# Patient Record
Sex: Female | Born: 1965 | Race: Black or African American | Hispanic: No | Smoking: Never smoker
Health system: Southern US, Community
[De-identification: ages and names within clinical notes are randomized; demographics above are authoritative.]

## PROBLEM LIST (undated history)

## (undated) DIAGNOSIS — T8859XA Other complications of anesthesia, initial encounter: Secondary | ICD-10-CM

## (undated) DIAGNOSIS — K219 Gastro-esophageal reflux disease without esophagitis: Secondary | ICD-10-CM

## (undated) DIAGNOSIS — I371 Nonrheumatic pulmonary valve insufficiency: Secondary | ICD-10-CM

## (undated) HISTORY — PX: OSTEOTOMY: SHX137

## (undated) HISTORY — PX: BREAST SURGERY: SHX581

---

## 2010-05-18 ENCOUNTER — Emergency Department: Payer: Self-pay | Admitting: Emergency Medicine

## 2010-09-22 ENCOUNTER — Emergency Department: Payer: Self-pay | Admitting: Emergency Medicine

## 2011-10-24 ENCOUNTER — Other Ambulatory Visit: Payer: Self-pay | Admitting: Specialist

## 2011-10-29 ENCOUNTER — Inpatient Hospital Stay
Admission: RE | Admit: 2011-10-29 | Discharge: 2011-10-29 | Payer: Self-pay | Source: Ambulatory Visit | Attending: Specialist | Admitting: Specialist

## 2011-11-01 ENCOUNTER — Ambulatory Visit
Admission: RE | Admit: 2011-11-01 | Discharge: 2011-11-01 | Disposition: A | Discharge status: Home / Self Care | Payer: Medicare Other | Source: Ambulatory Visit | Attending: Specialist | Admitting: Specialist

## 2015-11-30 ENCOUNTER — Other Ambulatory Visit (HOSPITAL_BASED_OUTPATIENT_CLINIC_OR_DEPARTMENT_OTHER): Payer: Self-pay | Admitting: Family Medicine

## 2015-11-30 ENCOUNTER — Ambulatory Visit (HOSPITAL_BASED_OUTPATIENT_CLINIC_OR_DEPARTMENT_OTHER)
Admission: RE | Admit: 2015-11-30 | Discharge: 2015-11-30 | Disposition: A | Discharge status: Home / Self Care | Payer: Medicare Other | Source: Ambulatory Visit | Attending: Family Medicine | Admitting: Family Medicine

## 2015-11-30 DIAGNOSIS — E049 Nontoxic goiter, unspecified: Secondary | ICD-10-CM

## 2016-12-10 ENCOUNTER — Other Ambulatory Visit (HOSPITAL_BASED_OUTPATIENT_CLINIC_OR_DEPARTMENT_OTHER): Payer: Self-pay | Admitting: Family Medicine

## 2016-12-10 DIAGNOSIS — N951 Menopausal and female climacteric states: Secondary | ICD-10-CM

## 2016-12-10 DIAGNOSIS — E041 Nontoxic single thyroid nodule: Secondary | ICD-10-CM

## 2016-12-11 ENCOUNTER — Other Ambulatory Visit (HOSPITAL_BASED_OUTPATIENT_CLINIC_OR_DEPARTMENT_OTHER): Payer: No Typology Code available for payment source

## 2016-12-11 ENCOUNTER — Ambulatory Visit (HOSPITAL_BASED_OUTPATIENT_CLINIC_OR_DEPARTMENT_OTHER)
Admission: RE | Admit: 2016-12-11 | Discharge: 2016-12-11 | Disposition: A | Discharge status: Home / Self Care | Payer: Medicare Other | Source: Ambulatory Visit | Attending: Family Medicine | Admitting: Family Medicine

## 2016-12-11 ENCOUNTER — Ambulatory Visit (HOSPITAL_BASED_OUTPATIENT_CLINIC_OR_DEPARTMENT_OTHER): Admission: RE | Admit: 2016-12-11 | Payer: Medicare Other | Source: Ambulatory Visit

## 2016-12-11 DIAGNOSIS — E041 Nontoxic single thyroid nodule: Secondary | ICD-10-CM

## 2016-12-11 DIAGNOSIS — N951 Menopausal and female climacteric states: Secondary | ICD-10-CM | POA: Diagnosis present

## 2017-07-26 ENCOUNTER — Encounter (HOSPITAL_BASED_OUTPATIENT_CLINIC_OR_DEPARTMENT_OTHER): Payer: Self-pay | Admitting: Emergency Medicine

## 2017-07-26 ENCOUNTER — Other Ambulatory Visit: Payer: Self-pay

## 2017-07-26 ENCOUNTER — Emergency Department (HOSPITAL_BASED_OUTPATIENT_CLINIC_OR_DEPARTMENT_OTHER)
Admission: EM | Admit: 2017-07-26 | Discharge: 2017-07-26 | Disposition: A | Discharge status: Home / Self Care | Payer: Medicare Other | Attending: Emergency Medicine | Admitting: Emergency Medicine

## 2017-07-26 DIAGNOSIS — Z0189 Encounter for other specified special examinations: Secondary | ICD-10-CM

## 2017-07-26 DIAGNOSIS — R2231 Localized swelling, mass and lump, right upper limb: Secondary | ICD-10-CM | POA: Diagnosis present

## 2017-07-26 DIAGNOSIS — Z7689 Persons encountering health services in other specified circumstances: Secondary | ICD-10-CM

## 2017-07-26 DIAGNOSIS — Z79899 Other long term (current) drug therapy: Secondary | ICD-10-CM | POA: Diagnosis not present

## 2017-07-26 DIAGNOSIS — L03011 Cellulitis of right finger: Secondary | ICD-10-CM | POA: Insufficient documentation

## 2017-07-26 HISTORY — DX: Nonrheumatic pulmonary valve insufficiency: I37.1

## 2017-07-26 MED ORDER — LIDOCAINE HCL 2 % IJ SOLN
10.0000 mL | Freq: Once | INTRAMUSCULAR | Status: AC
  Administered 2017-07-26: 200 mg via INTRADERMAL
  Filled 2017-07-26: qty 20

## 2017-07-26 MED ORDER — HYDROCODONE-ACETAMINOPHEN 5-325 MG PO TABS: 1.0000 | ORAL_TABLET | Freq: Four times a day (QID) | ORAL | 0 refills | Status: DC | PRN

## 2017-07-26 MED ORDER — SULFAMETHOXAZOLE-TRIMETHOPRIM 800-160 MG PO TABS: 1.0000 | ORAL_TABLET | Freq: Two times a day (BID) | ORAL | 0 refills | Status: AC

## 2017-07-26 NOTE — Discharge Instructions (Signed)
Soak the affected finger in warm water for 20 minutes every two hours when awake. You have been given a prescription for antibiotics to take if the pain and swelling do not begin to resolve within the next 24-48 hours. You have also been given a prescription of pain medications for the next few days. Do not go to work, operate a vehicle, or operate any other machinery while taking this medication. Return to the ED if you experience any fevers, chills, worsening redness, swelling, or pain to the area.

## 2017-07-26 NOTE — ED Provider Notes (Signed)
..  Incision and Drainage Date/Time: 07/26/2017 7:47 PM Performed by: Renne CriglerGeiple, Kayslee Furey, PA-C Authorized by: Renne CriglerGeiple, Monterius Rolf, PA-C   Consent:    Consent obtained:  Verbal   Consent given by:  Patient   Risks discussed:  Bleeding, incomplete drainage, pain and infection   Alternatives discussed:  No treatment and observation Location:    Type:  Abscess   Location:  Upper extremity   Upper extremity location:  Hand   Hand location:  R hand Pre-procedure details:    Skin preparation:  Betadine Anesthesia (see MAR for exact dosages):    Anesthesia method:  Local infiltration   Local anesthetic:  Lidocaine 1% w/o epi Procedure type:    Complexity:  Simple Procedure details:    Incision types:  Stab incision   Scalpel blade:  11   Drainage:  Bloody   Drainage amount:  Scant   Wound treatment:  Wound left open   Packing materials:  None Post-procedure details:    Patient tolerance of procedure:  Tolerated well, no immediate complications     Renne CriglerGeiple, Dyland Panuco, Cordelia Poche-C 07/26/17 1951    Tilden Fossaees, Elizabeth, MD 07/26/17 2205

## 2017-07-26 NOTE — ED Triage Notes (Signed)
Reports pain in R index finger near cuticle increasing over the last 3 days. Skin intact, no drainage, minimal edema, some erythema.

## 2017-07-26 NOTE — ED Provider Notes (Signed)
[facility] EMERGENCY DEPARTMENT Provider Note   CSN: [id number] Arrival date & time: 07/26/17  1816     History   Chief Complaint Chief Complaint  Patient presents with  . Hand Pain    HPI Lori BattiestCynthia Martin is a 51 y.o. female.  HPI   Pt is a 51 y/o female who presents to the ED c/o left index finger pain that began 4 days ago. The pain is throbbing in nature and she rates it at a 7/10. It has worsened since onset. She also reports redness and swelling, but denies any fevers, chills or other symptoms. She states that she is still able to move her finger. No other areas are affected and she has no other complaints. She denies any drainage or trauma to the area.  Past Medical History:  Diagnosis Date  . Leaky pulmonary heart valve     There are no active problems to display for this patient.   Past Surgical History:  Procedure Laterality Date  . BREAST SURGERY     lumpectomy x3  . CESAREAN SECTION    . OSTEOTOMY      OB History    No data available       Home Medications    Prior to Admission medications   Medication Sig Start Date End Date Taking? Authorizing Provider  Estradiol-Estriol-Progesterone (BIEST/PROGESTERONE) CREA Place onto the skin.   Yes [provider]  Nutritional Supplements (DHEA PO) Take by mouth.   Yes [provider]  progesterone (PROMETRIUM) 100 MG capsule Take 100 mg by mouth daily.   Yes [provider]  HYDROcodone-acetaminophen (NORCO/VICODIN) 5-325 MG tablet Take 1 tablet by mouth every 6 (six) hours as needed. 07/26/17   Mckinnon Glick S, PA-C  sulfamethoxazole-trimethoprim (BACTRIM DS,SEPTRA DS) 800-160 MG tablet Take 1 tablet by mouth 2 (two) times daily for 7 days. 07/26/17 08/02/17  Alyzae Hawkey S, PA-C    Family History No family history on file.  Social History Social History   Tobacco Use  . Smoking status: Never Smoker  . Smokeless tobacco: Never Used  Substance Use Topics  .  Alcohol use: No    Frequency: Never  . Drug use: No     Allergies   Patient has no known allergies.   Review of Systems Review of Systems  Constitutional: Negative for chills and fever.  Respiratory: Negative.   Cardiovascular: Negative.   Musculoskeletal:       ROM intact to the left index finger  Skin: Negative for rash.       Redness, swelling, and pain to the left index finger, no drainage or trauma  All other systems reviewed and are negative.    Physical Exam Updated Vital Signs BP 123/70 (BP Location: Right Arm)   Pulse 63   Temp 98.2 F (36.8 C) (Oral)   Resp 20   Ht 5\' 7"  (1.702 m)   Wt 68 kg (150 lb)   SpO2 100%   BMI 23.49 kg/m   Physical Exam  Constitutional: She is oriented to person, place, and time. She appears well-developed and well-nourished.  HENT:  Head: Normocephalic and atraumatic.  Eyes: Conjunctivae and EOM are normal.  Cardiovascular: Normal rate.  Pulmonary/Chest: Effort normal.  Musculoskeletal:  Normal ROM of the right index finger  Neurological: She is alert and oriented to person, place, and time.  Skin: Skin is warm. Capillary refill takes less than 2 seconds.  Right index finger is swollen, erythematous and painful. There is no  drainage to the area. The joint is unaffected.     ED Treatments / Results  Labs (all labs ordered are listed, but only abnormal results are displayed) Labs Reviewed - No data to display  EKG  EKG Interpretation None       Radiology No results found.  Procedures Procedures (including critical care time) Procedure note completed by [place] Mountain LakeGeiple, PA.  Medications Ordered in ED Medications  lidocaine (XYLOCAINE) 2 % (with pres) injection 200 mg (not administered)     Initial Impression / Assessment and Plan / ED Course  I have reviewed the triage vital signs and the nursing notes.  Pertinent labs & imaging results that were available during my care of the patient were reviewed by me and  considered in my medical decision making (see chart for details).   Evaluated pt in the ED. Discussed the option to send her home with antibiotics versus perform and I&D in the emergency department. Pt feels very strongly about avoiding antibiotics if possible and would like to opt to have the I&D. [facility] and there was no pus expelled from the wound. Discussed that since there was no pus removed from the wound that she may need to take antibiotics to clear the infection. Pt would like to observe if there is improvement of the finger pain before starting antibiotics. Advised pt that if she is no better in 24-48 then she should begin the antibiotics. I also wrote a prescription for Vicodin since she is in a significant amount of pain from this. 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 Return precautions were discussed and pt understands the plan. All questions answered.   Final Clinical Impressions(s) / ED Diagnoses   Final diagnoses:  Paronychia of right index finger  Encounter for incision and drainage procedure   Pt with paronychia in the ED. I&D performed in the ED. Pt still has FROM to the finger and is afebrile. She is safe for discharge with instructions to begin Bactrim if she does not observe improvement in the next 24-48 hours.   ED Discharge Orders        Ordered    sulfamethoxazole-trimethoprim (BACTRIM DS,SEPTRA DS) 800-160 MG tablet  2 times daily     07/26/17 1958    HYDROcodone-acetaminophen (NORCO/VICODIN) 5-325 MG tablet  Every 6 hours PRN     07/26/17 1958       Karrie MeresCouture, Shian Goodnow S, PA-C 07/26/17 2015    Tilden Fossaees, Elizabeth, MD 07/26/17 2205

## 2017-07-30 ENCOUNTER — Encounter (HOSPITAL_BASED_OUTPATIENT_CLINIC_OR_DEPARTMENT_OTHER): Payer: Self-pay | Admitting: Emergency Medicine

## 2017-07-30 ENCOUNTER — Other Ambulatory Visit: Payer: Self-pay

## 2017-07-30 ENCOUNTER — Emergency Department (HOSPITAL_BASED_OUTPATIENT_CLINIC_OR_DEPARTMENT_OTHER)
Admission: EM | Admit: 2017-07-30 | Discharge: 2017-07-31 | Disposition: A | Discharge status: Home / Self Care | Payer: Medicare Other | Attending: Emergency Medicine | Admitting: Emergency Medicine

## 2017-07-30 DIAGNOSIS — Z79899 Other long term (current) drug therapy: Secondary | ICD-10-CM | POA: Insufficient documentation

## 2017-07-30 DIAGNOSIS — L03011 Cellulitis of right finger: Secondary | ICD-10-CM | POA: Diagnosis not present

## 2017-07-30 DIAGNOSIS — R2231 Localized swelling, mass and lump, right upper limb: Secondary | ICD-10-CM | POA: Diagnosis present

## 2017-07-30 NOTE — ED Triage Notes (Signed)
Pt presents with pain and swelling to index finger to right hand.

## 2017-07-30 NOTE — ED Provider Notes (Signed)
[facility] EMERGENCY DEPARTMENT Provider Note   CSN: [id number] Arrival date & time: 07/30/17  2316     History   Chief Complaint Chief Complaint  Patient presents with  . Hand Pain    HPI Lori Martin is a 51 y.o. female.  The history is provided by the patient.  She comes in with pain and swelling of the right second finger distal phalanx.  She had been seen in emergency department on December 9 with paronychia, treated with incision and drainage.  About 2 days later, the swelling recurred and has been getting progressively worse.  She now rates pain at 10/10.  She had been discharged with prescription for hydrocodone-acetaminophen which she has been taking go without relief.  She denies fever chills or sweats.  Past Medical History:  Diagnosis Date  . Leaky pulmonary heart valve     There are no active problems to display for this patient.   Past Surgical History:  Procedure Laterality Date  . BREAST SURGERY     lumpectomy x3  . CESAREAN SECTION    . OSTEOTOMY      OB History    No data available       Home Medications    Prior to Admission medications   Medication Sig Start Date End Date Taking? Authorizing Provider  Estradiol-Estriol-Progesterone (BIEST/PROGESTERONE) CREA Place onto the skin.    [provider]  HYDROcodone-acetaminophen (NORCO/VICODIN) 5-325 MG tablet Take 1 tablet by mouth every 6 (six) hours as needed. 07/26/17   Couture, Cortni S, PA-C  Nutritional Supplements (DHEA PO) Take by mouth.    [provider]  progesterone (PROMETRIUM) 100 MG capsule Take 100 mg by mouth daily.    [provider]  sulfamethoxazole-trimethoprim (BACTRIM DS,SEPTRA DS) 800-160 MG tablet Take 1 tablet by mouth 2 (two) times daily for 7 days. 07/26/17 08/02/17  Couture, Cortni S, PA-C    Family History No family history on file.  Social History Social History   Tobacco Use  . Smoking status: Never Smoker  . Smokeless  tobacco: Never Used  Substance Use Topics  . Alcohol use: No    Frequency: Never  . Drug use: No     Allergies   Patient has no known allergies.   Review of Systems Review of Systems  All other systems reviewed and are negative.    Physical Exam Updated Vital Signs BP 109/72 (BP Location: Left Arm)   Pulse 70   Temp 98.1 F (36.7 C) (Oral)   Resp 18   Ht 5\' 7"  (1.702 m)   Wt 68 kg (150 lb)   SpO2 99%   BMI 23.49 kg/m   Physical Exam  Nursing note and vitals reviewed.  51 year old female, resting comfortably and in no acute distress. Vital signs are normal. Oxygen saturation is 99%, which is normal. Head is normocephalic and atraumatic. PERRLA, EOMI. Oropharynx is clear. Neck is nontender and supple without adenopathy or JVD. Back is nontender and there is no CVA tenderness. Lungs are clear without rales, wheezes, or rhonchi. Chest is nontender. Heart has regular rate and rhythm without murmur. Abdomen is soft, flat, nontender without masses or hepatosplenomegaly and peristalsis is normoactive. Extremities: Paronychia present right second finger. Skin is warm and dry without rash. Neurologic: Mental status is normal, cranial nerves are intact, there are no motor or sensory deficits.  ED Treatments / Results   Procedures .Marland Kitchen.Incision and Drainage Date/Time: 07/31/2017 12:19 AM Performed by: Dione BoozeGlick, Lamarion Mcevers, MD Authorized  by: Dione BoozeGlick, Lahna Nath, MD   Consent:    Consent obtained:  Verbal   Consent given by:  Patient   Risks discussed:  Bleeding, incomplete drainage, pain and infection   Alternatives discussed:  Delayed treatment Location:    Type:  Abscess   Location:  Upper extremity   Upper extremity location:  Finger   Finger location:  R index finger Pre-procedure details:    Skin preparation:  Chloraprep Anesthesia (see MAR for exact dosages):    Anesthesia method:  Nerve block   Block location:  Right second finger digital block   Block needle gauge:  25  G   Block anesthetic:  Lidocaine 2% w/o epi   Block injection procedure:  Anatomic landmarks identified, anatomic landmarks palpated, introduced needle, incremental injection and negative aspiration for blood   Block outcome:  Anesthesia achieved Procedure type:    Complexity:  Complex Procedure details:    Needle aspiration: no     Incision types:  Stab incision   Incision depth:  Subcutaneous   Scalpel size: Scissors.   Wound management:  Probed and deloculated   Drainage:  Purulent   Drainage amount:  Moderate   Wound treatment:  Wound left open   Packing materials:  None Post-procedure details:    Patient tolerance of procedure:  Tolerated well, no immediate complications   (including critical care time)  Medications Ordered in ED Medications  lidocaine (XYLOCAINE) 2 % (with pres) injection 200 mg (not administered)     Initial Impression / Assessment and Plan / ED Course  I have reviewed the triage vital signs and the nursing notes.  Paronychia right second finger.  Old records are reviewed confirming ED visit on December 9 for paronychia treated with incision and drainage.  Procedure note states stab incision was made to drain the infection.  Incision and drainage is repeated, and the wound explored with blunt dissection.  Given treatment failure, will have her follow-up with hand surgery to ensure adequate healing.  Final Clinical Impressions(s) / ED Diagnoses   Final diagnoses:  Paronychia of right index finger    ED Discharge Orders        Ordered    HYDROcodone-acetaminophen (NORCO/VICODIN) 5-325 MG tablet  Every 6 hours PRN     07/31/17 0035       Dione BoozeGlick, Emmet Messer, MD 07/31/17 [phone]

## 2017-07-31 MED ORDER — HYDROCODONE-ACETAMINOPHEN 5-325 MG PO TABS: 1.0000 | ORAL_TABLET | Freq: Four times a day (QID) | ORAL | 0 refills | Status: DC | PRN

## 2017-07-31 MED ORDER — LIDOCAINE HCL 2 % IJ SOLN
10.0000 mL | Freq: Once | INTRAMUSCULAR | Status: AC
  Administered 2017-07-31: 400 mg
  Filled 2017-07-31: qty 20

## 2017-07-31 NOTE — ED Notes (Signed)
Pt discharged to home with family. NAD.  

## 2018-07-01 ENCOUNTER — Other Ambulatory Visit (HOSPITAL_BASED_OUTPATIENT_CLINIC_OR_DEPARTMENT_OTHER): Payer: Self-pay | Admitting: Family Medicine

## 2018-07-01 ENCOUNTER — Encounter (HOSPITAL_BASED_OUTPATIENT_CLINIC_OR_DEPARTMENT_OTHER): Payer: Self-pay

## 2018-07-01 ENCOUNTER — Ambulatory Visit (HOSPITAL_BASED_OUTPATIENT_CLINIC_OR_DEPARTMENT_OTHER)
Admission: RE | Admit: 2018-07-01 | Discharge: 2018-07-01 | Disposition: A | Discharge status: Home / Self Care | Payer: Medicare Other | Source: Ambulatory Visit | Attending: Family Medicine | Admitting: Family Medicine

## 2018-07-01 DIAGNOSIS — R918 Other nonspecific abnormal finding of lung field: Secondary | ICD-10-CM | POA: Insufficient documentation

## 2018-07-01 DIAGNOSIS — R748 Abnormal levels of other serum enzymes: Secondary | ICD-10-CM

## 2018-07-01 DIAGNOSIS — E042 Nontoxic multinodular goiter: Secondary | ICD-10-CM

## 2018-08-14 ENCOUNTER — Other Ambulatory Visit: Payer: Self-pay

## 2018-08-14 ENCOUNTER — Encounter (HOSPITAL_BASED_OUTPATIENT_CLINIC_OR_DEPARTMENT_OTHER): Payer: Self-pay | Admitting: *Deleted

## 2018-08-14 ENCOUNTER — Emergency Department (HOSPITAL_BASED_OUTPATIENT_CLINIC_OR_DEPARTMENT_OTHER)
Admission: EM | Admit: 2018-08-14 | Discharge: 2018-08-14 | Disposition: A | Discharge status: Home / Self Care | Payer: Medicare Other | Attending: Emergency Medicine | Admitting: Emergency Medicine

## 2018-08-14 DIAGNOSIS — Y939 Activity, unspecified: Secondary | ICD-10-CM | POA: Insufficient documentation

## 2018-08-14 DIAGNOSIS — Z5321 Procedure and treatment not carried out due to patient leaving prior to being seen by health care provider: Secondary | ICD-10-CM | POA: Insufficient documentation

## 2018-08-14 DIAGNOSIS — Y929 Unspecified place or not applicable: Secondary | ICD-10-CM | POA: Insufficient documentation

## 2018-08-14 DIAGNOSIS — W311XXA Contact with metalworking machines, initial encounter: Secondary | ICD-10-CM | POA: Diagnosis not present

## 2018-08-14 DIAGNOSIS — Y999 Unspecified external cause status: Secondary | ICD-10-CM | POA: Diagnosis not present

## 2018-08-14 DIAGNOSIS — S6992XA Unspecified injury of left wrist, hand and finger(s), initial encounter: Secondary | ICD-10-CM | POA: Diagnosis present

## 2018-08-14 NOTE — ED Triage Notes (Signed)
Left index finger laceration with hand saw.  No bleeding noted. Small 1-2cm lac near knuckle.

## 2018-08-14 NOTE — ED Notes (Signed)
Pt told registration that they were leaving to go home.

## 2019-08-15 ENCOUNTER — Encounter (HOSPITAL_BASED_OUTPATIENT_CLINIC_OR_DEPARTMENT_OTHER): Payer: Self-pay | Admitting: *Deleted

## 2019-08-15 ENCOUNTER — Other Ambulatory Visit: Payer: Self-pay

## 2019-08-15 ENCOUNTER — Emergency Department (HOSPITAL_BASED_OUTPATIENT_CLINIC_OR_DEPARTMENT_OTHER)
Admission: EM | Admit: 2019-08-15 | Discharge: 2019-08-16 | Disposition: A | Discharge status: Home / Self Care | Payer: Medicare Other | Attending: Emergency Medicine | Admitting: Emergency Medicine

## 2019-08-15 ENCOUNTER — Emergency Department (HOSPITAL_BASED_OUTPATIENT_CLINIC_OR_DEPARTMENT_OTHER): Payer: Medicare Other

## 2019-08-15 DIAGNOSIS — Z79899 Other long term (current) drug therapy: Secondary | ICD-10-CM | POA: Diagnosis not present

## 2019-08-15 DIAGNOSIS — R002 Palpitations: Secondary | ICD-10-CM | POA: Diagnosis not present

## 2019-08-15 DIAGNOSIS — R42 Dizziness and giddiness: Secondary | ICD-10-CM | POA: Diagnosis present

## 2019-08-15 LAB — BASIC METABOLIC PANEL
Anion gap: 7 (ref 5–15)
BUN: 15 mg/dL (ref 6–20)
CO2: 27 mmol/L (ref 22–32)
Calcium: 9.5 mg/dL (ref 8.9–10.3)
Chloride: 105 mmol/L (ref 98–111)
Creatinine, Ser: 0.84 mg/dL (ref 0.44–1.00)
GFR calc Af Amer: >60 mL/min (ref >60)
GFR calc non Af Amer: >60 mL/min (ref >60)
Glucose, Bld: 93 mg/dL (ref 70–99)
Potassium: 4.2 mmol/L (ref 3.5–5.1)
Sodium: 139 mmol/L (ref 135–145)

## 2019-08-15 LAB — TROPONIN I (HIGH SENSITIVITY): Troponin I (High Sensitivity): 3 ng/L (ref <18)

## 2019-08-15 LAB — CBC
HCT: 42 % (ref 36.0–46.0)
Hemoglobin: 13.5 g/dL (ref 12.0–15.0)
MCH: 29.7 pg (ref 26.0–34.0)
MCHC: 32.1 g/dL (ref 30.0–36.0)
MCV: 92.3 fL (ref 80.0–100.0)
Platelets: 304 10*3/uL (ref 150–400)
RBC: 4.55 MIL/uL (ref 3.87–5.11)
RDW: 13.5 % (ref 11.5–15.5)
WBC: 7.6 10*3/uL (ref 4.0–10.5)
nRBC: 0 % (ref 0.0–0.2)

## 2019-08-15 MED ORDER — SODIUM CHLORIDE 0.9% FLUSH
3.0000 mL | Freq: Once | INTRAVENOUS | Status: DC
  Filled 2019-08-15: qty 3

## 2019-08-15 NOTE — ED Triage Notes (Addendum)
For 2 days she has felt like her heart is racing. The feeling has been off and on. C.o dizziness worse when she bends over. Tingling in her left hand that comes and goes that started yesterday. She felt anxious before the symptoms started. She had a negative stress test by her cardiologist recently.

## 2019-08-16 LAB — TROPONIN I (HIGH SENSITIVITY): Troponin I (High Sensitivity): 3 ng/L (ref <18)

## 2019-08-16 NOTE — ED Notes (Signed)
Pt. Ambulated through halls. Dizziness noted and a "tight" feeling in throat. Pt. Worried as BP has never been as high as it's reading tonight.

## 2019-08-16 NOTE — ED Provider Notes (Signed)
[facility] Provider Note   CSN: [id number] Arrival date & time: 08/15/19  2149     History Chief Complaint  Patient presents with  . Dizziness    Lori Martin is a 53 y.o. female.  The history is provided by the patient.  Dizziness Quality:  Lightheadedness Severity:  Severe Onset quality:  Sudden Duration: 30 minutes. Timing:  Constant Progression:  Improving Chronicity:  New Associated symptoms: palpitations    Patient presents with palpitations and dizziness.  She reports over the previous day she has had at least 2-3 episodes of her heart racing and feeling lightheaded.  They last about 30 minutes.  She is unsure what triggers the symptoms.  During these episodes she has some chest tightness and shortness of breath.  No LOC.  No syncope.  No fevers or vomiting.  No new change in medications.  She had a similar episode the previous week.  Denies any recent anxiety symptoms.  She does report "tingling "in multiple extremities at times.  No focal weakness. She was seen by a cardiologist once recently and was told she had a normal stress test.  She reports being very active and not having any chest pain or fatigue with activity She was told she had a leaky pulmonary valve, but is unclear if it is causing long-term issues  Past Medical History:  Diagnosis Date  . Leaky pulmonary heart valve     There are no problems to display for this patient.   Past Surgical History:  Procedure Laterality Date  . BREAST SURGERY     lumpectomy x3  . CESAREAN SECTION    . OSTEOTOMY       OB History   No obstetric history on file.     No family history on file.  Social History   Tobacco Use  . Smoking status: Never Smoker  . Smokeless tobacco: Never Used  Substance Use Topics  . Alcohol use: No  . Drug use: No    Home Medications Prior to Admission medications   Medication Sig Start Date End Date Taking? Authorizing Provider    Estradiol-Estriol-Progesterone (BIEST/PROGESTERONE) CREA Place onto the skin.   Yes [provider]  Nutritional Supplements (DHEA PO) Take by mouth.   Yes [provider]  progesterone (PROMETRIUM) 100 MG capsule Take 100 mg by mouth daily.   Yes [provider]  HYDROcodone-acetaminophen (NORCO/VICODIN) 5-325 MG tablet Take 1 tablet by mouth every 6 (six) hours as needed. 19/37/90   Delora Fuel, MD    Allergies    Patient has no known allergies.  Review of Systems   Review of Systems  Constitutional: Negative for fever.  Cardiovascular: Positive for palpitations.  Neurological: Positive for dizziness. Negative for syncope.  All other systems reviewed and are negative.   Physical Exam Updated Vital Signs BP 124/75 (BP Location: Right Arm)   Pulse (!) 55   Temp 98.6 F (37 C) (Oral)   Resp 18   Ht 1.689 m (5' 6.5")   Wt 72.6 kg   SpO2 100%   BMI 25.44 kg/m   Physical Exam CONSTITUTIONAL: Well developed/well nourished HEAD: Normocephalic/atraumatic EYES: EOMI/PERRL ENMT: Mask in place NECK: supple no meningeal signs SPINE/BACK:entire spine nontender CV: S1/S2 noted, no murmurs/rubs/gallops noted LUNGS: Lungs are clear to auscultation bilaterally, no apparent distress ABDOMEN: soft, nontender, no rebound or guarding, bowel sounds noted throughout abdomen GU:no cva tenderness NEURO: Pt is awake/alert/appropriate, moves all extremitiesx4.  No facial droop.  No arm  or leg drift EXTREMITIES: pulses normal/equal, full ROM, equal pulses all extremities SKIN: warm, color normal PSYCH: no abnormalities of mood noted, alert and oriented to situation  ED Results / Procedures / Treatments   Labs (all labs ordered are listed, but only abnormal results are displayed) Labs Reviewed  BASIC METABOLIC PANEL  CBC  TROPONIN I (HIGH SENSITIVITY)  TROPONIN I (HIGH SENSITIVITY)    EKG EKG Interpretation  Date/Time:  Monday August 15 2019 21:54:28  EST Ventricular Rate:  60 PR Interval:  166 QRS Duration: 74 QT Interval:  424 QTC Calculation: 424 R Axis:   63 Text Interpretation: Normal sinus rhythm Normal ECG No significant change since last tracing Confirmed by Zadie Rhine ([id number]) on 08/15/2019 11:34:21 PM   Radiology DG Chest 2 View  Result Date: 08/15/2019 CLINICAL DATA:  Dizziness EXAM: CHEST - 2 VIEW COMPARISON:  05/18/2010 FINDINGS: The heart size and mediastinal contours are within normal limits. Both lungs are clear. The visualized skeletal structures are unremarkable. IMPRESSION: No active cardiopulmonary disease. Electronically Signed   By: Jasmine Pang M.D.   On: 08/15/2019 22:35    Procedures Procedures   Medications Ordered in ED Medications - No data to display  ED Course  I have reviewed the triage vital signs and the nursing notes.  Pertinent labs & imaging results that were available during my care of the patient were reviewed by me and considered in my medical decision making (see chart for details).    MDM Rules/Calculators/A&P                      Patient presents with increasing episodes of palpitations.  No syncope is reported.  EKG here is unchanged.  No significant dysrhythmias noted on her telemetry monitoring.  Patient was able to ambulate though reported some dizziness, but her vitals remain appropriate. At this point I feel she is appropriate for discharge home.  Low suspicion for ACS/PE. No signs of dysrhythmia.  Would benefit from outpatient Holter monitoring, she was referred to local cardiologist for this. We discussed strict return precautions Final Clinical Impression(s) / ED Diagnoses Final diagnoses:  Palpitations    Rx / DC Orders ED Discharge Orders    None       Zadie Rhine, MD 08/16/19 [phone]

## 2019-08-23 ENCOUNTER — Other Ambulatory Visit (HOSPITAL_BASED_OUTPATIENT_CLINIC_OR_DEPARTMENT_OTHER): Payer: Self-pay | Admitting: Family Medicine

## 2019-08-23 DIAGNOSIS — E042 Nontoxic multinodular goiter: Secondary | ICD-10-CM

## 2019-08-24 ENCOUNTER — Ambulatory Visit (HOSPITAL_BASED_OUTPATIENT_CLINIC_OR_DEPARTMENT_OTHER)
Admission: RE | Admit: 2019-08-24 | Discharge: 2019-08-24 | Disposition: A | Discharge status: Home / Self Care | Payer: Medicare Other | Source: Ambulatory Visit | Attending: Family Medicine | Admitting: Family Medicine

## 2019-08-24 ENCOUNTER — Other Ambulatory Visit: Payer: Self-pay

## 2019-08-24 DIAGNOSIS — E042 Nontoxic multinodular goiter: Secondary | ICD-10-CM | POA: Insufficient documentation

## 2020-05-08 ENCOUNTER — Other Ambulatory Visit: Payer: Self-pay | Admitting: Orthopedic Surgery

## 2020-05-31 ENCOUNTER — Encounter (HOSPITAL_BASED_OUTPATIENT_CLINIC_OR_DEPARTMENT_OTHER): Payer: Self-pay | Admitting: Orthopedic Surgery

## 2020-05-31 ENCOUNTER — Other Ambulatory Visit: Payer: Self-pay

## 2020-06-04 ENCOUNTER — Other Ambulatory Visit (HOSPITAL_COMMUNITY): Payer: Medicare Other

## 2020-06-05 ENCOUNTER — Other Ambulatory Visit (HOSPITAL_COMMUNITY)
Admission: RE | Admit: 2020-06-05 | Discharge: 2020-06-05 | Disposition: A | Discharge status: Home / Self Care | Payer: Medicare Other | Source: Ambulatory Visit | Attending: Orthopedic Surgery | Admitting: Orthopedic Surgery

## 2020-06-05 DIAGNOSIS — Z20822 Contact with and (suspected) exposure to covid-19: Secondary | ICD-10-CM | POA: Diagnosis not present

## 2020-06-05 DIAGNOSIS — Z01812 Encounter for preprocedural laboratory examination: Secondary | ICD-10-CM | POA: Insufficient documentation

## 2020-06-05 LAB — SARS CORONAVIRUS 2 (TAT 6-24 HRS): SARS Coronavirus 2: NEGATIVE

## 2020-06-07 ENCOUNTER — Ambulatory Visit (HOSPITAL_BASED_OUTPATIENT_CLINIC_OR_DEPARTMENT_OTHER)
Admission: RE | Admit: 2020-06-07 | Discharge: 2020-06-07 | Disposition: A | Discharge status: Home / Self Care | Payer: Medicare Other | Source: Ambulatory Visit | Attending: Orthopedic Surgery | Admitting: Orthopedic Surgery

## 2020-06-07 ENCOUNTER — Encounter (HOSPITAL_BASED_OUTPATIENT_CLINIC_OR_DEPARTMENT_OTHER)
Admission: RE | Disposition: A | Discharge status: Home / Self Care | Payer: Self-pay | Source: Ambulatory Visit | Attending: Orthopedic Surgery

## 2020-06-07 ENCOUNTER — Encounter (HOSPITAL_BASED_OUTPATIENT_CLINIC_OR_DEPARTMENT_OTHER): Payer: Self-pay | Admitting: Orthopedic Surgery

## 2020-06-07 ENCOUNTER — Ambulatory Visit (HOSPITAL_BASED_OUTPATIENT_CLINIC_OR_DEPARTMENT_OTHER): Payer: Medicare Other | Admitting: Anesthesiology

## 2020-06-07 ENCOUNTER — Other Ambulatory Visit: Payer: Self-pay

## 2020-06-07 DIAGNOSIS — B079 Viral wart, unspecified: Secondary | ICD-10-CM | POA: Diagnosis not present

## 2020-06-07 DIAGNOSIS — Z888 Allergy status to other drugs, medicaments and biological substances status: Secondary | ICD-10-CM | POA: Diagnosis not present

## 2020-06-07 DIAGNOSIS — Z880 Allergy status to penicillin: Secondary | ICD-10-CM | POA: Insufficient documentation

## 2020-06-07 DIAGNOSIS — Z91040 Latex allergy status: Secondary | ICD-10-CM | POA: Diagnosis not present

## 2020-06-07 DIAGNOSIS — L989 Disorder of the skin and subcutaneous tissue, unspecified: Secondary | ICD-10-CM | POA: Diagnosis present

## 2020-06-07 HISTORY — PX: MASS EXCISION: SHX2000

## 2020-06-07 HISTORY — DX: Gastro-esophageal reflux disease without esophagitis: K21.9

## 2020-06-07 HISTORY — DX: Other complications of anesthesia, initial encounter: T88.59XA

## 2020-06-07 SURGERY — EXCISION MASS
Anesthesia: Regional | Site: Thumb | Laterality: Right

## 2020-06-07 MED ORDER — PROPOFOL 500 MG/50ML IV EMUL
INTRAVENOUS | Status: DC | PRN
  Administered 2020-06-07: 100 ug/kg/min via INTRAVENOUS

## 2020-06-07 MED ORDER — FENTANYL CITRATE (PF) 100 MCG/2ML IJ SOLN
INTRAMUSCULAR | Status: AC
  Filled 2020-06-07: qty 2

## 2020-06-07 MED ORDER — MIDAZOLAM HCL 2 MG/2ML IJ SOLN
INTRAMUSCULAR | Status: AC
  Filled 2020-06-07: qty 2

## 2020-06-07 MED ORDER — BUPIVACAINE HCL (PF) 0.5 % IJ SOLN
INTRAMUSCULAR | Status: AC
  Filled 2020-06-07: qty 30

## 2020-06-07 MED ORDER — LIDOCAINE 2% (20 MG/ML) 5 ML SYRINGE
INTRAMUSCULAR | Status: AC
  Filled 2020-06-07: qty 5

## 2020-06-07 MED ORDER — BUPIVACAINE HCL (PF) 0.25 % IJ SOLN
INTRAMUSCULAR | Status: DC | PRN
  Administered 2020-06-07: 9 mL

## 2020-06-07 MED ORDER — LIDOCAINE HCL (PF) 0.5 % IJ SOLN
INTRAMUSCULAR | Status: DC | PRN
  Administered 2020-06-07: 30 mL via INTRAVENOUS

## 2020-06-07 MED ORDER — ONDANSETRON HCL 4 MG/2ML IJ SOLN
INTRAMUSCULAR | Status: AC
  Filled 2020-06-07: qty 2

## 2020-06-07 MED ORDER — LACTATED RINGERS IV SOLN: INTRAVENOUS | Status: DC

## 2020-06-07 MED ORDER — VANCOMYCIN HCL IN DEXTROSE 1-5 GM/200ML-% IV SOLN
1000.0000 mg | Freq: Once | INTRAVENOUS | Status: AC
  Administered 2020-06-07: 1000 mg via INTRAVENOUS

## 2020-06-07 MED ORDER — HYDROCODONE-ACETAMINOPHEN 5-325 MG PO TABS: ORAL_TABLET | ORAL | 0 refills | Status: DC

## 2020-06-07 MED ORDER — OXYCODONE HCL 5 MG/5ML PO SOLN: 5.0000 mg | Freq: Once | ORAL | Status: DC | PRN

## 2020-06-07 MED ORDER — HYDROMORPHONE HCL 1 MG/ML IJ SOLN: 0.2500 mg | INTRAMUSCULAR | Status: DC | PRN

## 2020-06-07 MED ORDER — OXYCODONE HCL 5 MG PO TABS: 5.0000 mg | ORAL_TABLET | Freq: Once | ORAL | Status: DC | PRN

## 2020-06-07 MED ORDER — MIDAZOLAM HCL 5 MG/5ML IJ SOLN
INTRAMUSCULAR | Status: DC | PRN
  Administered 2020-06-07: 2 mg via INTRAVENOUS

## 2020-06-07 MED ORDER — PROPOFOL 10 MG/ML IV BOLUS
INTRAVENOUS | Status: DC | PRN
  Administered 2020-06-07: 30 mg via INTRAVENOUS
  Administered 2020-06-07: 20 mg via INTRAVENOUS

## 2020-06-07 MED ORDER — PROPOFOL 500 MG/50ML IV EMUL
INTRAVENOUS | Status: AC
  Filled 2020-06-07: qty 50

## 2020-06-07 MED ORDER — MEPERIDINE HCL 25 MG/ML IJ SOLN: 6.2500 mg | INTRAMUSCULAR | Status: DC | PRN

## 2020-06-07 MED ORDER — AMISULPRIDE (ANTIEMETIC) 5 MG/2ML IV SOLN: 10.0000 mg | Freq: Once | INTRAVENOUS | Status: DC | PRN

## 2020-06-07 MED ORDER — ONDANSETRON HCL 4 MG/2ML IJ SOLN
INTRAMUSCULAR | Status: DC | PRN
  Administered 2020-06-07: 4 mg via INTRAVENOUS

## 2020-06-07 MED ORDER — CEFAZOLIN SODIUM-DEXTROSE 2-4 GM/100ML-% IV SOLN: 2.0000 g | INTRAVENOUS | Status: DC

## 2020-06-07 MED ORDER — VANCOMYCIN HCL IN DEXTROSE 1-5 GM/200ML-% IV SOLN
INTRAVENOUS | Status: AC
  Filled 2020-06-07: qty 200

## 2020-06-07 MED ORDER — BUPIVACAINE HCL (PF) 0.25 % IJ SOLN
INTRAMUSCULAR | Status: AC
  Filled 2020-06-07: qty 30

## 2020-06-07 MED ORDER — FENTANYL CITRATE (PF) 100 MCG/2ML IJ SOLN
INTRAMUSCULAR | Status: DC | PRN
  Administered 2020-06-07 (×2): 50 ug via INTRAVENOUS

## 2020-06-07 SURGICAL SUPPLY — 57 items
APL PRP STRL LF DISP 70% ISPRP (MISCELLANEOUS) ×1
APL SKNCLS STERI-STRIP NONHPOA (GAUZE/BANDAGES/DRESSINGS)
BENZOIN TINCTURE PRP APPL 2/3 (GAUZE/BANDAGES/DRESSINGS) IMPLANT
BLADE MINI RND TIP GREEN BEAV (BLADE) IMPLANT
BLADE SURG 15 STRL LF DISP TIS (BLADE) ×2 IMPLANT
BLADE SURG 15 STRL SS (BLADE) ×6
BNDG CMPR 9X4 STRL LF SNTH (GAUZE/BANDAGES/DRESSINGS) ×1
BNDG COHESIVE 1X5 TAN STRL LF (GAUZE/BANDAGES/DRESSINGS) ×3 IMPLANT
BNDG COHESIVE 2X5 TAN STRL LF (GAUZE/BANDAGES/DRESSINGS) IMPLANT
BNDG CONFORM 2 STRL LF (GAUZE/BANDAGES/DRESSINGS) IMPLANT
BNDG ELASTIC 2X5.8 VLCR STR LF (GAUZE/BANDAGES/DRESSINGS) IMPLANT
BNDG ELASTIC 3X5.8 VLCR STR LF (GAUZE/BANDAGES/DRESSINGS) IMPLANT
BNDG ESMARK 4X9 LF (GAUZE/BANDAGES/DRESSINGS) ×3 IMPLANT
BNDG GAUZE 1X2.1 STRL (MISCELLANEOUS) IMPLANT
BNDG GAUZE ELAST 4 BULKY (GAUZE/BANDAGES/DRESSINGS) IMPLANT
BNDG PLASTER X FAST 3X3 WHT LF (CAST SUPPLIES) IMPLANT
BNDG PLSTR 9X3 FST ST WHT (CAST SUPPLIES)
CHLORAPREP W/TINT 26 (MISCELLANEOUS) ×3 IMPLANT
CLOSURE WOUND 1/2 X4 (GAUZE/BANDAGES/DRESSINGS)
CORD BIPOLAR FORCEPS 12FT (ELECTRODE) ×3 IMPLANT
COVER BACK TABLE 60X90IN (DRAPES) ×3 IMPLANT
COVER MAYO STAND STRL (DRAPES) ×3 IMPLANT
COVER WAND RF STERILE (DRAPES) IMPLANT
CUFF TOURN SGL QUICK 18X4 (TOURNIQUET CUFF) ×3 IMPLANT
DRAPE EXTREMITY T 121X128X90 (DISPOSABLE) ×3 IMPLANT
DRAPE SURG 17X23 STRL (DRAPES) ×3 IMPLANT
GAUZE SPONGE 4X4 12PLY STRL (GAUZE/BANDAGES/DRESSINGS) ×3 IMPLANT
GAUZE XEROFORM 1X8 LF (GAUZE/BANDAGES/DRESSINGS) ×3 IMPLANT
GLOVE BIO SURGEON STRL SZ7.5 (GLOVE) IMPLANT
GLOVE BIOGEL PI IND STRL 8 (GLOVE) ×1 IMPLANT
GLOVE BIOGEL PI INDICATOR 8 (GLOVE) ×2
GLOVE SURG SS PI 6.5 STRL IVOR (GLOVE) ×6 IMPLANT
GLOVE SURG SS PI 7.5 STRL IVOR (GLOVE) ×3 IMPLANT
GOWN STRL REUS W/ TWL LRG LVL3 (GOWN DISPOSABLE) ×1 IMPLANT
GOWN STRL REUS W/TWL LRG LVL3 (GOWN DISPOSABLE) ×3
GOWN STRL REUS W/TWL XL LVL3 (GOWN DISPOSABLE) ×3 IMPLANT
NEEDLE HYPO 25X1 1.5 SAFETY (NEEDLE) ×3 IMPLANT
NS IRRIG 1000ML POUR BTL (IV SOLUTION) ×3 IMPLANT
PACK BASIN DAY SURGERY FS (CUSTOM PROCEDURE TRAY) ×3 IMPLANT
PAD CAST 3X4 CTTN HI CHSV (CAST SUPPLIES) IMPLANT
PAD CAST 4YDX4 CTTN HI CHSV (CAST SUPPLIES) IMPLANT
PADDING CAST ABS 4INX4YD NS (CAST SUPPLIES)
PADDING CAST ABS COTTON 4X4 ST (CAST SUPPLIES) IMPLANT
PADDING CAST COTTON 3X4 STRL (CAST SUPPLIES)
PADDING CAST COTTON 4X4 STRL (CAST SUPPLIES)
SPLINT FINGER 3.25 BULB 911905 (SOFTGOODS) ×3 IMPLANT
STOCKINETTE 4X48 STRL (DRAPES) ×3 IMPLANT
STRIP CLOSURE SKIN 1/2X4 (GAUZE/BANDAGES/DRESSINGS) IMPLANT
SUT ETHILON 3 0 PS 1 (SUTURE) IMPLANT
SUT ETHILON 4 0 PS 2 18 (SUTURE) ×3 IMPLANT
SUT ETHILON 5 0 P 3 18 (SUTURE)
SUT NYLON ETHILON 5-0 P-3 1X18 (SUTURE) IMPLANT
SUT VIC AB 4-0 P2 18 (SUTURE) ×3 IMPLANT
SYR BULB EAR ULCER 3OZ GRN STR (SYRINGE) ×3 IMPLANT
SYR CONTROL 10ML LL (SYRINGE) ×3 IMPLANT
TOWEL GREEN STERILE FF (TOWEL DISPOSABLE) ×6 IMPLANT
UNDERPAD 30X36 HEAVY ABSORB (UNDERPADS AND DIAPERS) ×3 IMPLANT

## 2020-06-07 NOTE — Discharge Instructions (Addendum)

## 2020-06-07 NOTE — Anesthesia Preprocedure Evaluation (Addendum)
Anesthesia Evaluation  Patient identified by MRN, date of birth, ID band Patient awake    Reviewed: Allergy & Precautions, NPO status , Patient's Chart, lab work & pertinent test results  Airway Mallampati: II  TM Distance: >3 FB Neck ROM: Full    Dental no notable dental hx.    Pulmonary neg pulmonary ROS,    Pulmonary exam normal breath sounds clear to auscultation       Cardiovascular negative cardio ROS Normal cardiovascular exam Rhythm:Regular Rate:Normal     Neuro/Psych negative neurological ROS  negative psych ROS   GI/Hepatic Neg liver ROS, GERD  ,  Endo/Other  negative endocrine ROS  Renal/GU negative Renal ROS  negative genitourinary   Musculoskeletal negative musculoskeletal ROS (+)   Abdominal   Peds negative pediatric ROS (+)  Hematology negative hematology ROS (+)   Anesthesia Other Findings   Reproductive/Obstetrics negative OB ROS                             Anesthesia Physical Anesthesia Plan  ASA: II  Anesthesia Plan: Bier Block and Bier Block-LIDOCAINE ONLY   Post-op Pain Management:    Induction: Intravenous  PONV Risk Score and Plan: 2 and Ondansetron, Midazolam and Treatment may vary due to age or medical condition  Airway Management Planned: Simple Face Mask  Additional Equipment:   Intra-op Plan:   Post-operative Plan:   Informed Consent: I have reviewed the patients History and Physical, chart, labs and discussed the procedure including the risks, benefits and alternatives for the proposed anesthesia with the patient or authorized representative who has indicated his/her understanding and acceptance.     Dental advisory given  Plan Discussed with: CRNA  Anesthesia Plan Comments:       Anesthesia Quick Evaluation

## 2020-06-07 NOTE — Anesthesia Procedure Notes (Signed)
Anesthesia Regional Block: Bier block (IV Regional)   Pre-Anesthetic Checklist: ,, timeout performed, Correct Patient, Correct Site, Correct Laterality, Correct Procedure,, site marked, surgical consent,, at surgeon's request  Laterality: Right     Needles:  Injection technique: Single-shot  Needle Type: Other      Needle Gauge: 22     Additional Needles:   Procedures:,,,,, intact distal pulses, Esmarch exsanguination, single tourniquet utilized,  Narrative:  Start time: 06/07/2020 9:15 AM End time: 06/07/2020 9:16 AM  Performed by: Personally

## 2020-06-07 NOTE — H&P (Signed)
Lori Martin is an 54 y.o. female.   Chief Complaint: right thumb lesion HPI: 54 yo female with lesions right thumb.  They are bothersome to her.  She wishes to have them removed.  Allergies:  Allergies  Allergen Reactions  . Penicillins Other (See Comments)    "I don't remember but I don't take it"  . Latex Itching  . Phenergan [Promethazine Hcl] Anxiety    Pt had panic attack and extreme anxiety    Past Medical History:  Diagnosis Date  . Complication of anesthesia    slow to wake up  . GERD (gastroesophageal reflux disease)   . Leaky pulmonary heart valve     Past Surgical History:  Procedure Laterality Date  . BREAST SURGERY     lumpectomy x3  . CESAREAN SECTION    . OSTEOTOMY      Family History: Family History  Problem Relation Age of Onset  . Hypertension Father     Social History:   reports that she has never smoked. She has never used smokeless tobacco. She reports that she does not drink alcohol and does not use drugs.  Medications: Medications Prior to Admission  Medication Sig Dispense Refill  . esomeprazole (NEXIUM) 40 MG capsule Take 40 mg by mouth daily at 12 noon.    . Estradiol-Estriol-Progesterone (BIEST/PROGESTERONE) CREA Place onto the skin.    . Pregnenolone 50 MG TABS Take 50 mg by mouth.    . progesterone (PROMETRIUM) 100 MG capsule Take 100 mg by mouth daily.      Results for orders placed or performed during the hospital encounter of 06/05/20 (from the past 48 hour(s))  SARS CORONAVIRUS 2 (TAT 6-24 HRS) Nasopharyngeal Nasopharyngeal Swab     Status: None   Collection Time: 06/05/20  3:10 PM   Specimen: Nasopharyngeal Swab  Result Value Ref Range   SARS Coronavirus 2 NEGATIVE NEGATIVE    Comment: (NOTE) SARS-CoV-2 target nucleic acids are NOT DETECTED.  The SARS-CoV-2 RNA is generally detectable in upper and lower respiratory specimens during the acute phase of infection. Negative results do not preclude SARS-CoV-2 infection, do  not rule out co-infections with other pathogens, and should not be used as the sole basis for treatment or other patient management decisions. Negative results must be combined with clinical observations, patient history, and epidemiological information. The expected result is Negative.  Fact Sheet for Patients: HairSlick.no  Fact Sheet for Healthcare Providers: quierodirigir.com  This test is not yet approved or cleared by the [place] FDA and  has been authorized for detection and/or diagnosis of SARS-CoV-2 by FDA under an Emergency Use Authorization (EUA). This EUA will remain  in effect (meaning this test can be used) for the duration of the COVID-19 declaration under Se ction 564(b)(1) of the Act, 21 U.S.C. section 360bbb-3(b)(1), unless the authorization is terminated or revoked sooner.  Performed at [facility], [address], [place], [place] [address]     No results found.   A comprehensive review of systems was negative.  Blood pressure 118/75, temperature 98 F (36.7 C), temperature source Oral, resp. rate 16, height 5\' 7"  (1.702 m), weight 71.4 kg, SpO2 100 %.  General appearance: alert, cooperative and appears stated age Head: Normocephalic, without obvious abnormality, atraumatic Neck: supple, symmetrical, trachea midline Cardio: regular rate and rhythm Resp: clear to auscultation bilaterally Extremities: Intact sensation and capillary refill all digits.  +epl/fpl/io.  No wounds.  Pulses: 2+ and symmetric Skin: Skin color, texture, turgor normal.  No rashes or lesions Neurologic: Grossly normal Incision/Wound: none  Assessment/Plan Right thumb skin lesions.  Plan excision of lesions with possible skin grafting.  Non operative and operative treatment options have been discussed with the patient and patient wishes to proceed with operative treatment. Risks, benefits, and alternatives of surgery  have been discussed and the patient agrees with the plan of care.   Lori Martin 06/07/2020, 8:54 AM

## 2020-06-07 NOTE — Transfer of Care (Signed)
Immediate Anesthesia Transfer of Care Note  Patient: Lori Martin  Procedure(s) Performed: EXCISION LESION RIGHT THUMB (Right Thumb)  Patient Location: PACU  Anesthesia Type:MAC and Bier block  Level of Consciousness: awake, alert  and oriented  Airway & Oxygen Therapy: Patient Spontanous Breathing and Patient connected to face mask oxygen  Post-op Assessment: Report given to RN and Post -op Vital signs reviewed and stable  Post vital signs: Reviewed and stable  Last Vitals:  Vitals Value Taken Time  BP 100/67 06/07/20 0947  Temp    Pulse 51 06/07/20 0951  Resp 12 06/07/20 0951  SpO2 100 % 06/07/20 0951  Vitals shown include unvalidated device data.  Last Pain:  Vitals:   06/07/20 0831  TempSrc: Oral  PainSc: 0-No pain      Patients Stated Pain Goal: 3 (81/85/63 1497)  Complications: No complications documented.

## 2020-06-07 NOTE — Anesthesia Postprocedure Evaluation (Signed)
Anesthesia Post Note  Patient: Lori Martin  Procedure(s) Performed: EXCISION LESION RIGHT THUMB (Right Thumb)     Patient location during evaluation: PACU Anesthesia Type: Bier Block Level of consciousness: awake and alert Pain management: pain level controlled Vital Signs Assessment: post-procedure vital signs reviewed and stable Respiratory status: spontaneous breathing, nonlabored ventilation and respiratory function stable Cardiovascular status: blood pressure returned to baseline and stable Postop Assessment: no apparent nausea or vomiting Anesthetic complications: no   No complications documented.  Last Vitals:  Vitals:   06/07/20 1030 06/07/20 1048  BP: 105/69 122/70  Pulse: (!) 51 64  Resp: 12 12  Temp:    SpO2: 100% 100%    Last Pain:  Vitals:   06/07/20 1048  TempSrc:   PainSc: 0-No pain                 Lowella Curb

## 2020-06-07 NOTE — Op Note (Signed)
NAME: Lori Martin MEDICAL RECORD NO: [id number] DATE OF BIRTH: 16-May-1966 FACILITY: [facility] LOCATION: [facility] PHYSICIAN: Tami Ribas, MD   OPERATIVE REPORT   DATE OF PROCEDURE: 06/07/20    PREOPERATIVE DIAGNOSIS:   Right thumb skin lesions x2   POSTOPERATIVE DIAGNOSIS:   Right thumb skin lesions x2   PROCEDURE:   Excision right thumb skin lesions x2; 1.0 cm excised diameter each   SURGEON:  [name], M.D.   ASSISTANT: none   ANESTHESIA:  Bier block with sedation   INTRAVENOUS FLUIDS:  Per anesthesia flow sheet.   ESTIMATED BLOOD LOSS:  Minimal.   COMPLICATIONS:  None.   SPECIMENS:   Right thumb skin lesions to pathology   TOURNIQUET TIME:    Total Tourniquet Time Documented: Forearm (Right) - 24 minutes Total: Forearm (Right) - 24 minutes    DISPOSITION:  Stable to PACU.   INDICATIONS: 54 year old female who is noted to skin lesions on her right thumb. These have not resolved with nonoperative treatment. She wishes to have them excised. Risks, benefits and alternatives of surgery were discussed including the risks of blood loss, infection, damage to nerves, vessels, tendons, ligaments, bone for surgery, need for additional surgery, complications with wound healing, continued pain, stiffness, recurrence of lesion.  She voiced understanding of these risks and elected to proceed.  OPERATIVE COURSE:  After being identified preoperatively by myself,  the patient and I agreed on the procedure and site of the procedure.  The surgical site was marked.  Surgical consent had been signed. She was given IV antibiotics as preoperative antibiotic prophylaxis. She was transferred to the operating room and placed on the operating table in supine position with the Right upper extremity on an arm board.  Bier block anesthesia was induced by the anesthesiologist.  Right upper extremity was prepped and draped in normal sterile orthopedic fashion.  A surgical pause  was performed between the surgeons, anesthesia, and operating room staff and all were in agreement as to the patient, procedure, and site of procedure.  Tourniquet at the proximal aspect of the forearm had been inflated for the Bier block.   Elliptical incision was made around the lesion over the proximal phalanx of the thumb. This was carried into subcutaneous tissues by spreading technique. The lesion itself was involved in the dermis but not the subcutaneous tissues. It was carefully freed up of all tissue and excised with the skin. The diameter of the excision was 1.0 cm. The second lesion was in the distal phalanx of the thumb. This was again ellipsed out. It was within the dermis. It was carefully freed up from the subcutaneous tissues and removed. Both lesions were sent to pathology. The excised diameter of the second lesion was 1.0 cm. The wounds were copiously irrigated with sterile saline. Bipolar cautery was used to obtain hemostasis. The wounds were able to be closed with 4-0 nylon suture in a horizontal mattress fashion. A digital block of been performed with quarter percent plain Marcaine. The wounds were dressed with sterile Xeroform 4 x 4 and wrapped with a Coban dressing lightly. An AlumaFoam splint was placed and wrapped light with Coban dressing. The tourniquet was deflated at 24 minutes.  Fingertips were pink with brisk capillary refill after deflation of tourniquet.  The operative  drapes were broken down.  The patient was awoken from anesthesia safely.  She was transferred back to the stretcher and taken to PACU in stable condition.  I will see her  back in the office in 1 week for postoperative followup.  I will give her a prescription for Norco 5/325 1-2 tabs PO q6 hours prn pain, dispense # 20.   [name], MD Electronically signed, 06/07/20

## 2020-06-08 ENCOUNTER — Encounter (HOSPITAL_BASED_OUTPATIENT_CLINIC_OR_DEPARTMENT_OTHER): Payer: Self-pay | Admitting: Orthopedic Surgery

## 2020-06-20 LAB — SURGICAL PATHOLOGY

## 2021-08-12 ENCOUNTER — Encounter (HOSPITAL_BASED_OUTPATIENT_CLINIC_OR_DEPARTMENT_OTHER): Payer: Self-pay | Admitting: *Deleted

## 2021-08-12 ENCOUNTER — Emergency Department (HOSPITAL_BASED_OUTPATIENT_CLINIC_OR_DEPARTMENT_OTHER)
Admission: EM | Admit: 2021-08-12 | Discharge: 2021-08-12 | Disposition: A | Discharge status: Home / Self Care | Payer: Medicare Other | Attending: Emergency Medicine | Admitting: Emergency Medicine

## 2021-08-12 ENCOUNTER — Other Ambulatory Visit: Payer: Self-pay

## 2021-08-12 DIAGNOSIS — U071 COVID-19: Secondary | ICD-10-CM | POA: Diagnosis not present

## 2021-08-12 DIAGNOSIS — R059 Cough, unspecified: Secondary | ICD-10-CM | POA: Diagnosis present

## 2021-08-12 DIAGNOSIS — Z91040 Latex allergy status: Secondary | ICD-10-CM | POA: Insufficient documentation

## 2021-08-12 MED ORDER — BENZONATATE 100 MG PO CAPS: 100.0000 mg | ORAL_CAPSULE | Freq: Three times a day (TID) | ORAL | 0 refills | Status: DC

## 2021-08-12 MED ORDER — NIRMATRELVIR/RITONAVIR (PAXLOVID)TABLET: 3.0000 | ORAL_TABLET | Freq: Two times a day (BID) | ORAL | 0 refills | Status: AC

## 2021-08-12 NOTE — ED Triage Notes (Signed)
Covid positive home test yesterday. Here with cough, headache and back pain.

## 2021-08-12 NOTE — Discharge Instructions (Signed)
Take the Paxil Oved twice daily for 5 days.  For fever, HA, sore throat or body aches - take tylenol ([phone] mg) every 8 hours. Do not exceed 3000mg /day. You ca also take ibuprofen.   For cough, take tessalon perles every 8 hours as needed. You can also use cough drops or drink honey.  Return if things worsen or change.

## 2021-08-12 NOTE — ED Provider Notes (Signed)
[facility] EMERGENCY DEPARTMENT Provider Note   CSN: [id number] Arrival date & time: 08/12/21  1641     History No chief complaint on file.   Lori Martin is a 55 y.o. female.  HPI  Patient presents with cough, myalgias, positive home COVID test.  Her symptoms started 4 days ago, she has been trying Tylenol and Motrin at home.  Reports the myalgias are so bad to get in the way of her sleeping at night.  No obvious provoking features.  At home remedies have not been alleviating but time does seem to be improving the amount of coughing she has.  Patient states she is here because she is interested in antivirals to help with the myalgias.  Denies chest pain or shortness of breath, has been congested at home.  Past Medical History:  Diagnosis Date   Complication of anesthesia    slow to wake up   GERD (gastroesophageal reflux disease)    Leaky pulmonary heart valve     There are no problems to display for this patient.   Past Surgical History:  Procedure Laterality Date   BREAST SURGERY     lumpectomy x3   CESAREAN SECTION     MASS EXCISION Right 06/07/2020   Procedure: EXCISION LESION RIGHT THUMB;  Surgeon: [name], MD;  Location: [facility];  Service: Orthopedics;  Laterality: Right;   OSTEOTOMY       OB History   No obstetric history on file.     Family History  Problem Relation Age of Onset   Hypertension Father     Social History   Tobacco Use   Smoking status: Never   Smokeless tobacco: Never  Vaping Use   Vaping Use: Never used  Substance Use Topics   Alcohol use: No   Drug use: No    Home Medications Prior to Admission medications   Medication Sig Start Date End Date Taking? Authorizing Provider  Estradiol-Estriol-Progesterone (BIEST/PROGESTERONE) CREA Place onto the skin.   Yes [provider]  Pregnenolone 50 MG TABS Take 50 mg by mouth.   Yes [provider]  esomeprazole (NEXIUM) 40 MG  capsule Take 40 mg by mouth daily at 12 noon.    [provider]  HYDROcodone-acetaminophen (NORCO) 5-325 MG tablet 1-2 tabs po q6 hours prn pain 06/07/20   [name], MD  progesterone (PROMETRIUM) 100 MG capsule Take 100 mg by mouth daily.    [provider]    Allergies    Penicillins, Latex, and Phenergan [promethazine hcl]  Review of Systems   Review of Systems  Constitutional:  Positive for fever.  HENT:  Positive for congestion. Negative for sore throat.   Respiratory:  Negative for shortness of breath.   Cardiovascular:  Negative for chest pain.  Gastrointestinal:  Negative for nausea and vomiting.  Musculoskeletal:  Positive for back pain and myalgias.  Neurological:  Positive for headaches.   Physical Exam Updated Vital Signs BP 108/78 (BP Location: Right Arm)    Pulse 78    Temp 98.3 F (36.8 C) (Oral)    Resp 17    Ht 5\' 7"  (1.702 m)    Wt 70.3 kg    SpO2 100%    BMI 24.28 kg/m   Physical Exam Vitals and nursing note reviewed. Exam conducted with a chaperone present.  Constitutional:      Appearance: Normal appearance.  HENT:     Head: Normocephalic and atraumatic.  Eyes:  General: No scleral icterus.       Right eye: No discharge.        Left eye: No discharge.     Extraocular Movements: Extraocular movements intact.     Pupils: Pupils are equal, round, and reactive to light.  Cardiovascular:     Rate and Rhythm: Normal rate and regular rhythm.     Pulses: Normal pulses.     Heart sounds: Normal heart sounds. No murmur heard.   No friction rub. No gallop.  Pulmonary:     Effort: Pulmonary effort is normal. No respiratory distress.     Breath sounds: Normal breath sounds.     Comments: Lungs are clear to auscultation bilaterally Abdominal:     General: Abdomen is flat. Bowel sounds are normal. There is no distension.     Palpations: Abdomen is soft.     Tenderness: There is no abdominal tenderness.  Skin:    General: Skin is warm  and dry.     Coloration: Skin is not jaundiced.  Neurological:     Mental Status: She is alert. Mental status is at baseline.     Coordination: Coordination normal.    ED Results / Procedures / Treatments   Labs (all labs ordered are listed, but only abnormal results are displayed) Labs Reviewed  RESP PANEL BY RT-PCR (FLU A&B, COVID) [id number]    EKG None  Radiology No results found.  Procedures Procedures   Medications Ordered in ED Medications - No data to display  ED Course  I have reviewed the triage vital signs and the nursing notes.  Pertinent labs & imaging results that were available during my care of the patient were reviewed by me and considered in my medical decision making (see chart for details).    MDM Rules/Calculators/A&P                         This is a 55 year old female presenting with COVID symptoms and COVID home positive diagnosis.  Patient does not wish to repeat or confirm the test with viral testing at this time.  She has been having COVID symptoms for 4 days, interested in Paxil Oved.  Given its within 5 days and her kidney function is greater than 60 per the last CMP I do think that is reasonable.  Her vitals are stable, she is not febrile or tachycardic.  Not hypoxic.  Lungs are clear to auscultation bilaterally, at this time do not feel the patient would benefit from additional work-up.  We will send Paxil Oved and Tessalon Perles to the patient's pharmacy.  Patient discharged in stable condition.     Final Clinical Impression(s) / ED Diagnoses Final diagnoses:  None    Rx / DC Orders ED Discharge Orders     None        Theron Arista, PA-C 08/12/21 1846    Gloris Manchester, MD 08/14/21 [phone]

## 2021-08-12 NOTE — ED Notes (Signed)
Severe body aches and headaches x4 days. Had positive COVID home test. Dyspnea with exertion

## 2021-10-11 IMAGING — CR DG CHEST 2V
2 series · 2 of 2 positions shown · non-contrast
Comparison: 05/18/2010

CLINICAL DATA: Dizziness

EXAM:
CHEST - 2 VIEW

[w chest pa]
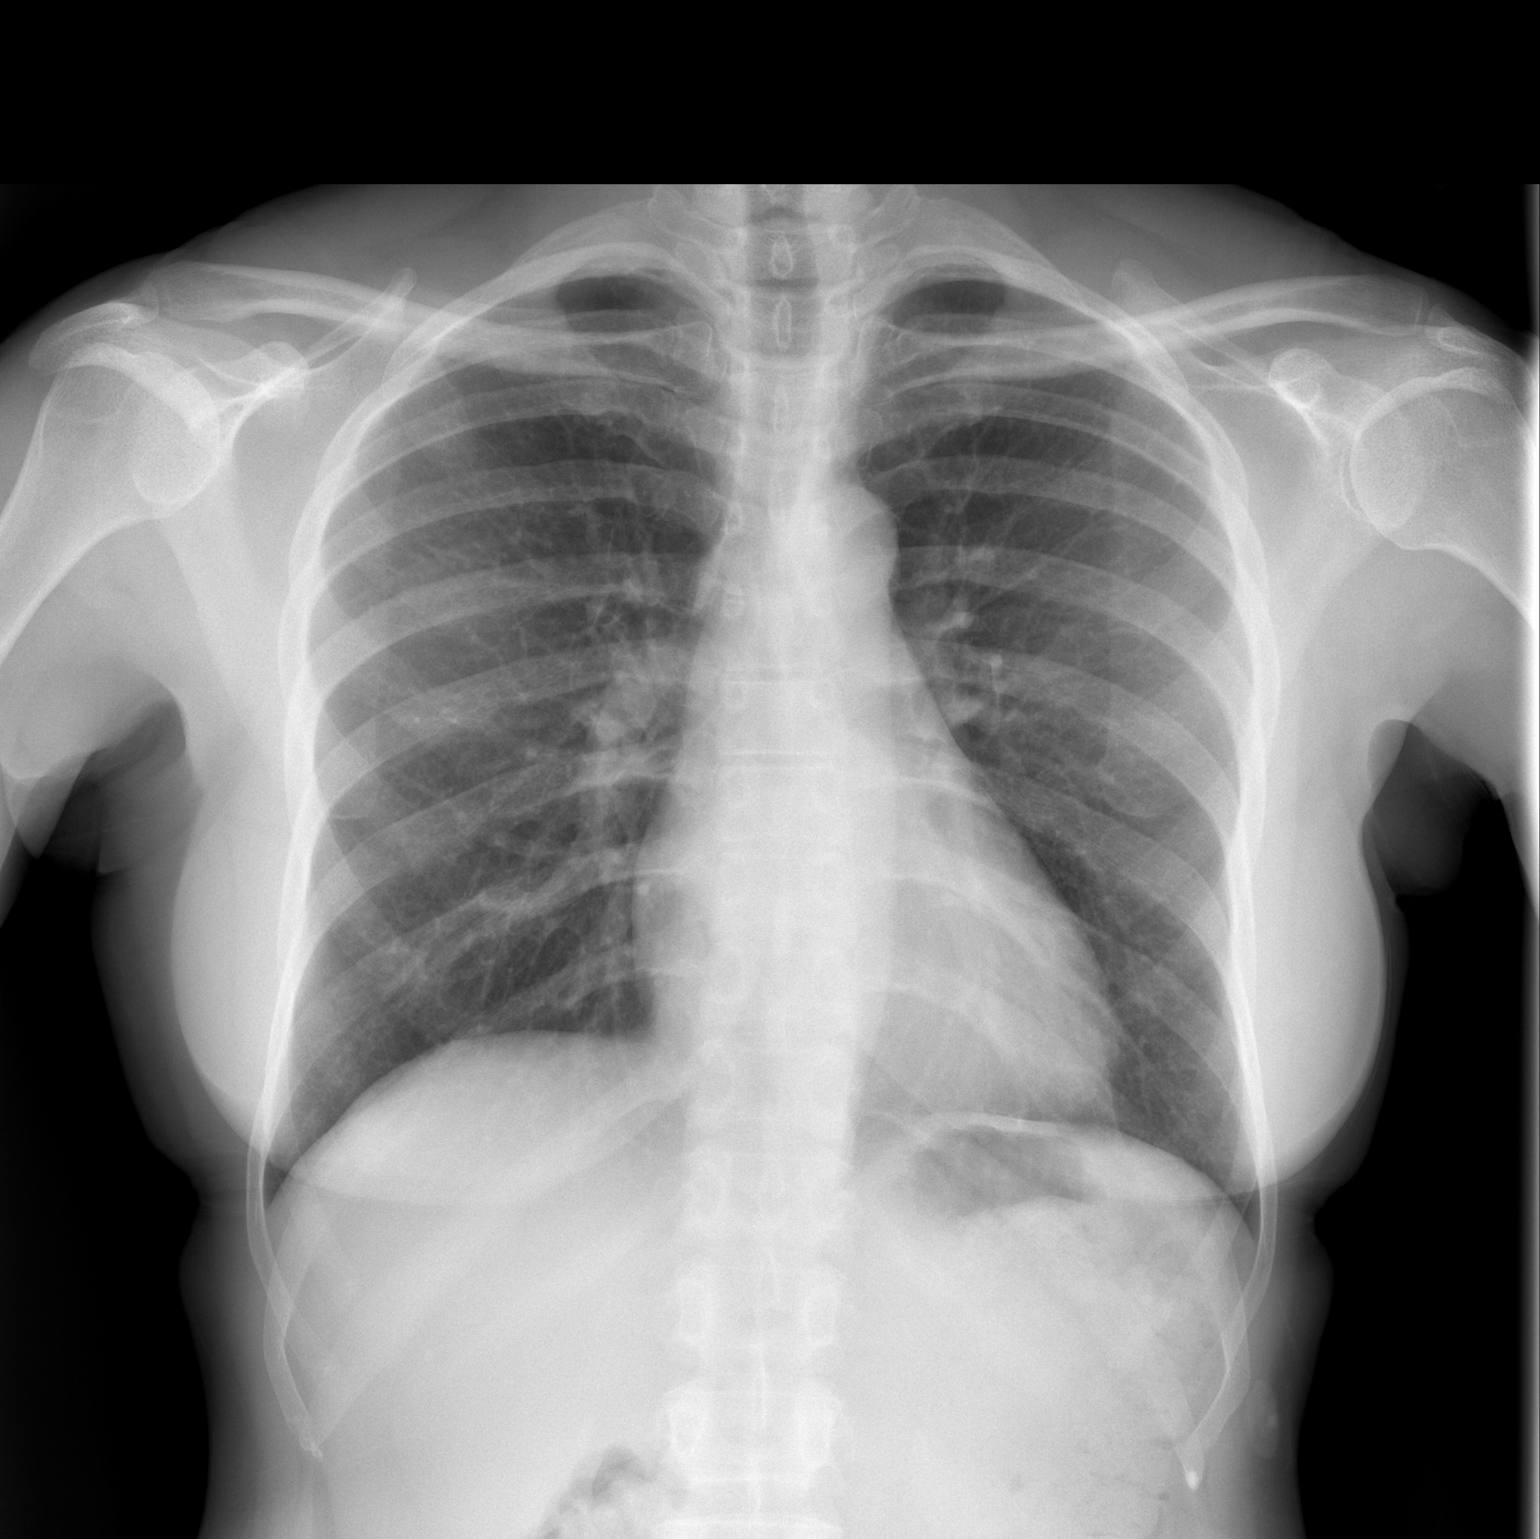

[w chest lat]
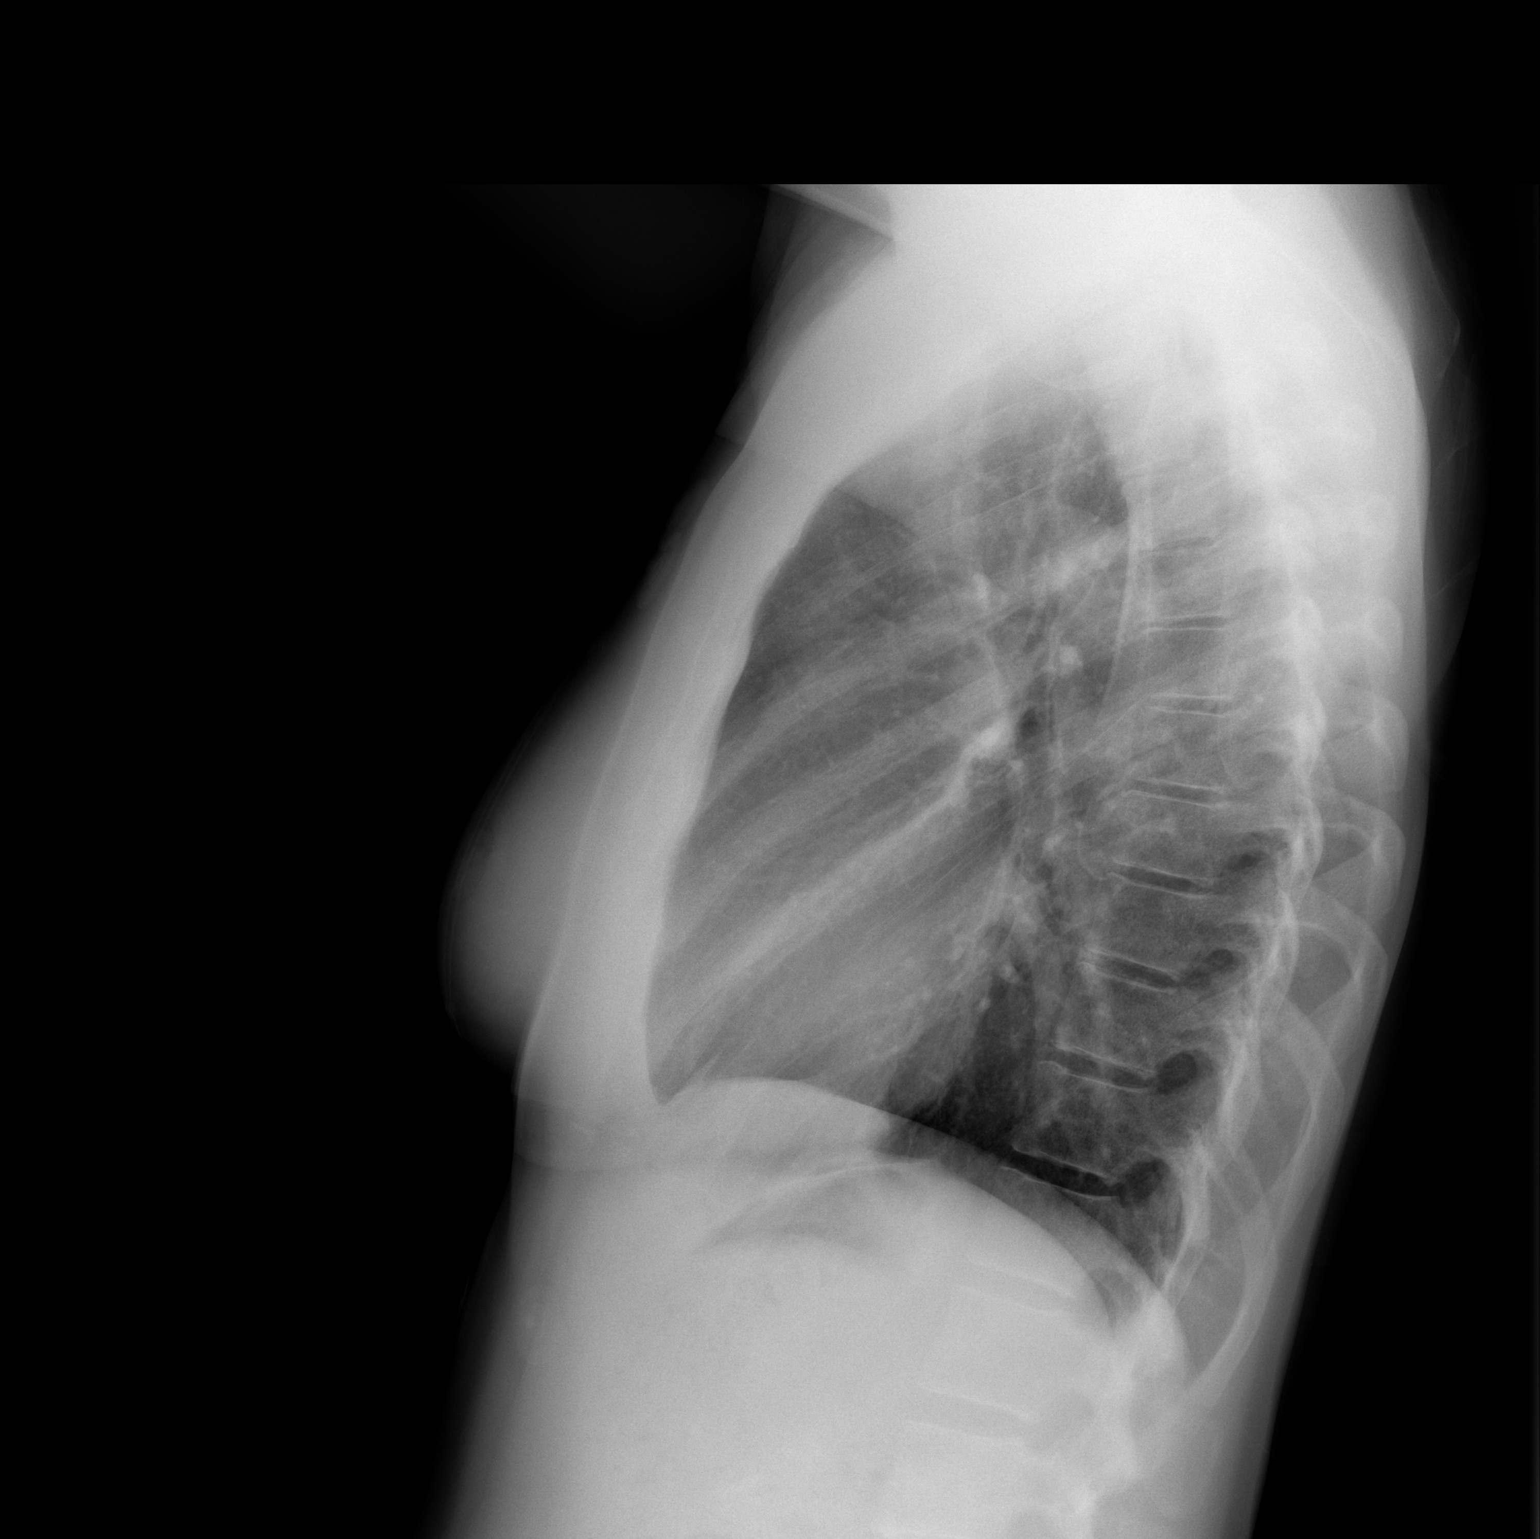

[2 of 2 positions shown; findings below may reference images not displayed]

FINDINGS: The heart size and mediastinal contours are within normal limits.
Both lungs are clear. The visualized skeletal structures are
unremarkable.
IMPRESSION: No active cardiopulmonary disease.

## 2021-10-20 IMAGING — US US THYROID
1 series · 14 of 25 positions shown · non-contrast
Comparison: 07/01/2018

CLINICAL DATA: Thyroid nodules

EXAM:
THYROID ULTRASOUND
TECHNIQUE: Ultrasound examination of the thyroid gland and adjacent soft
tissues was performed.

[Series 1: us thyroid · 27 acquisitions, 14 frames shown]
[im 1/27]
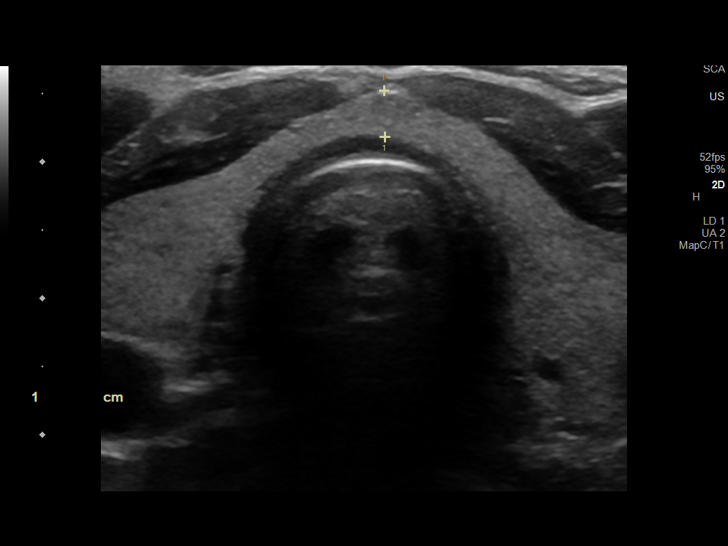
[im 3/27]
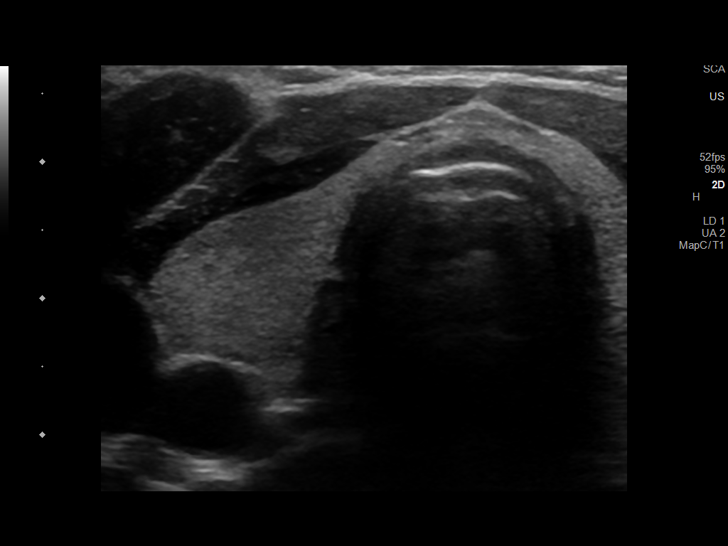
[im 5/27]
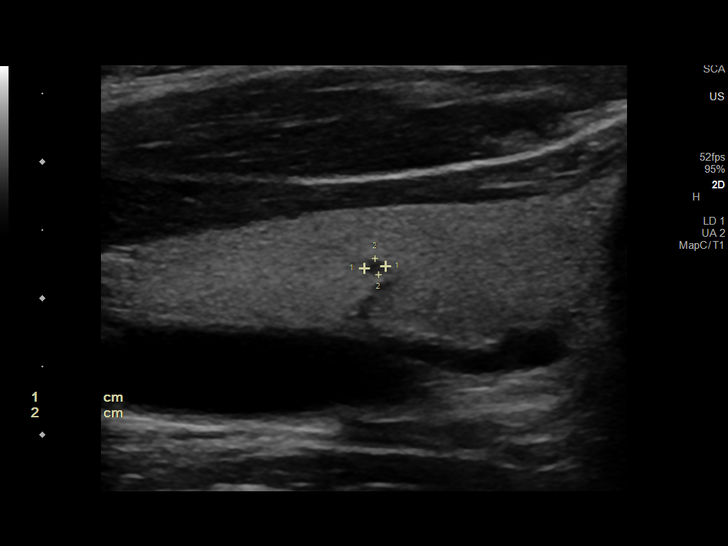
[im 7/27]
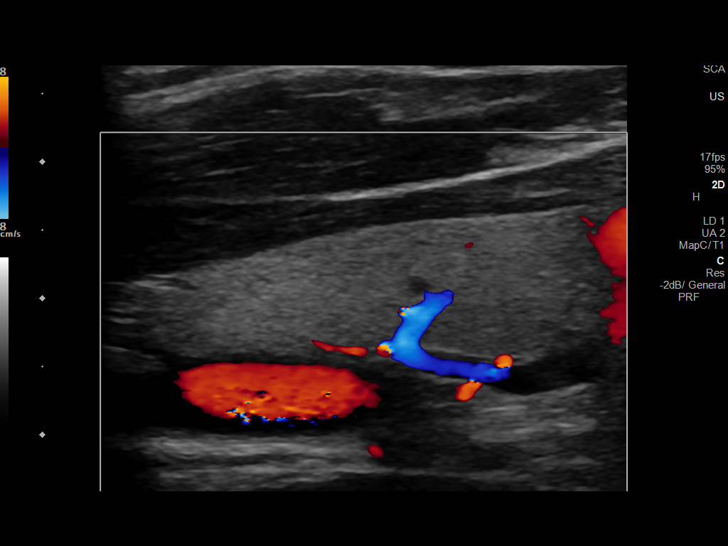
[im 9/27]
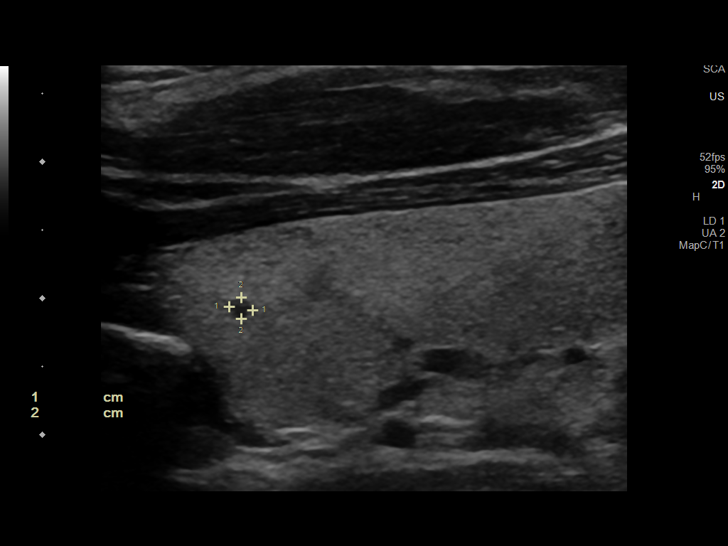
[im 10/27]
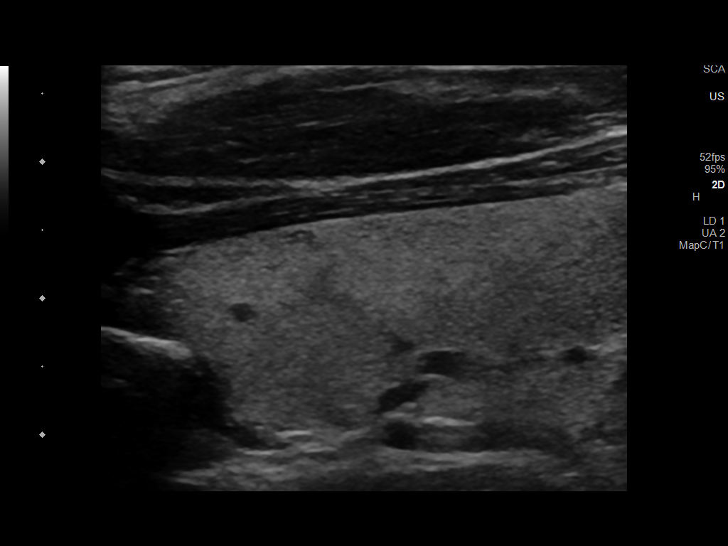
[im 12/27]
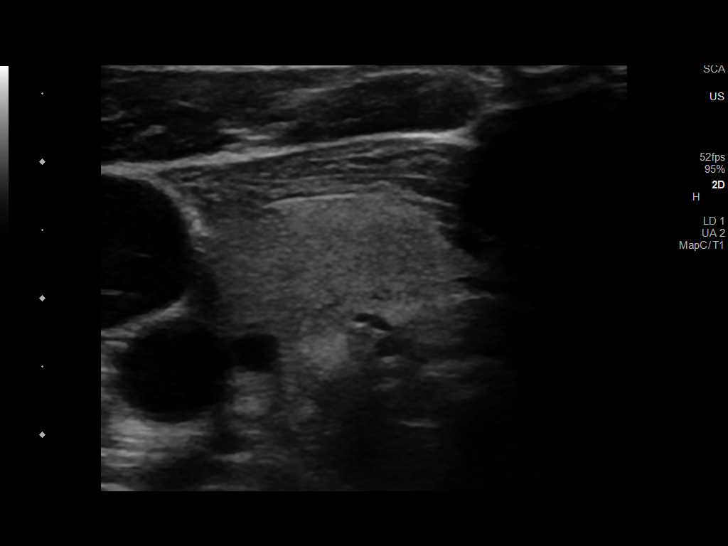
[im 15/27]
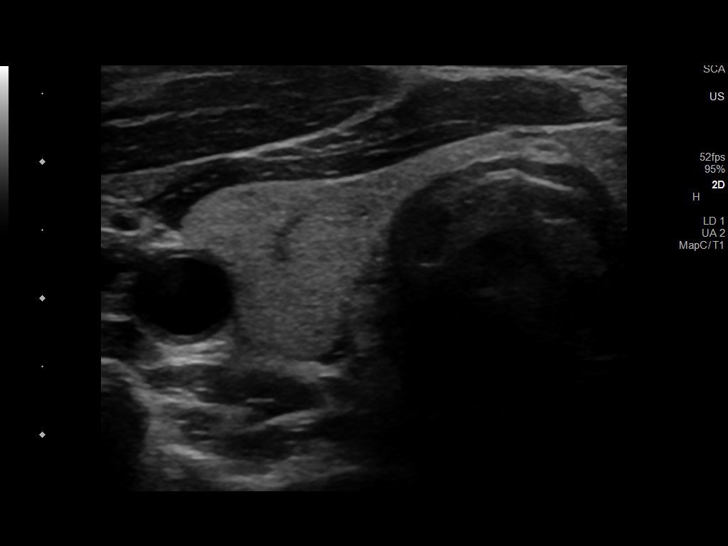
[im 17/27]
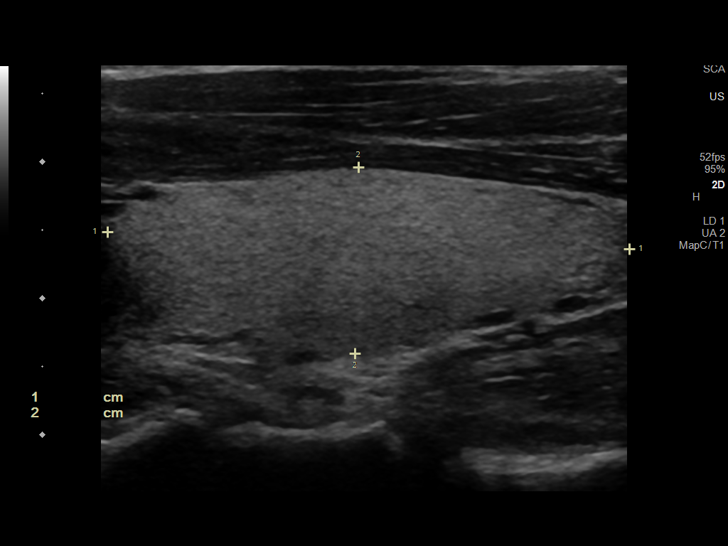
[im 18/27]
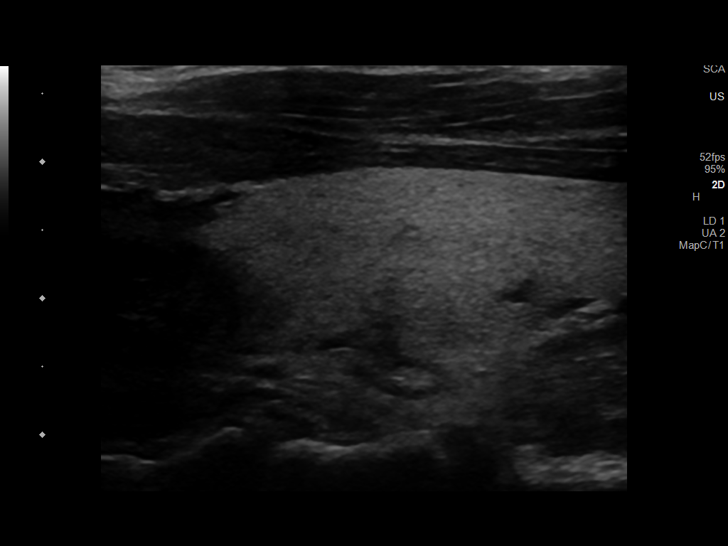
[im 20/27]
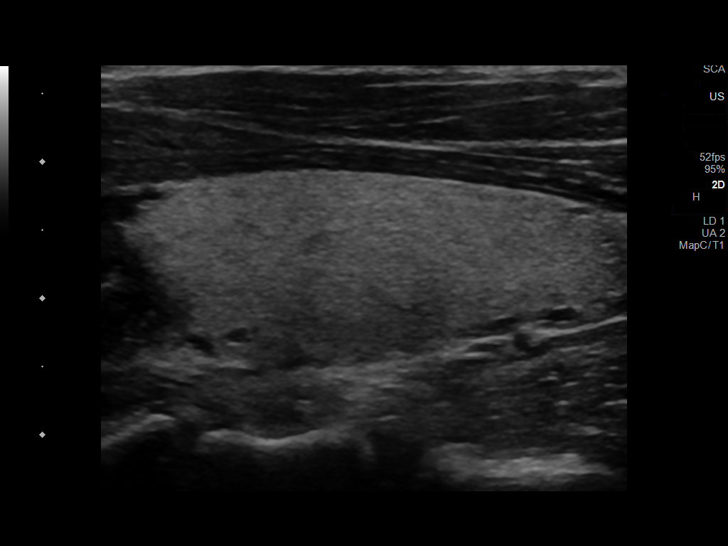
[im 22/27]
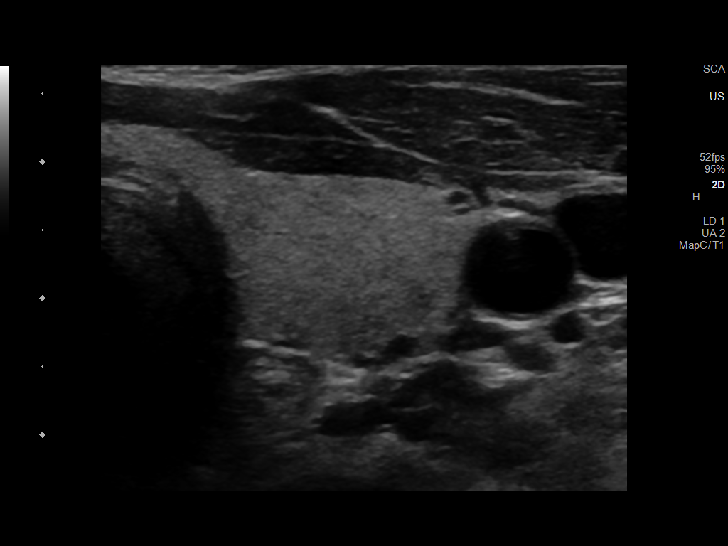
[im 24/27]
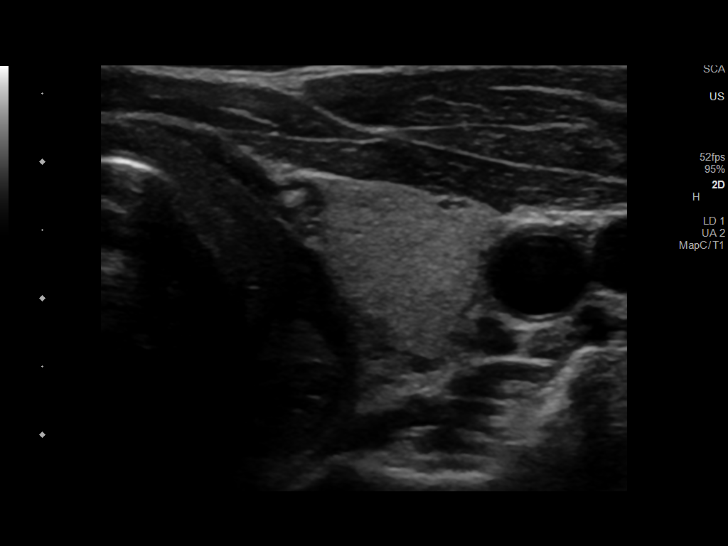
[im 27/27]
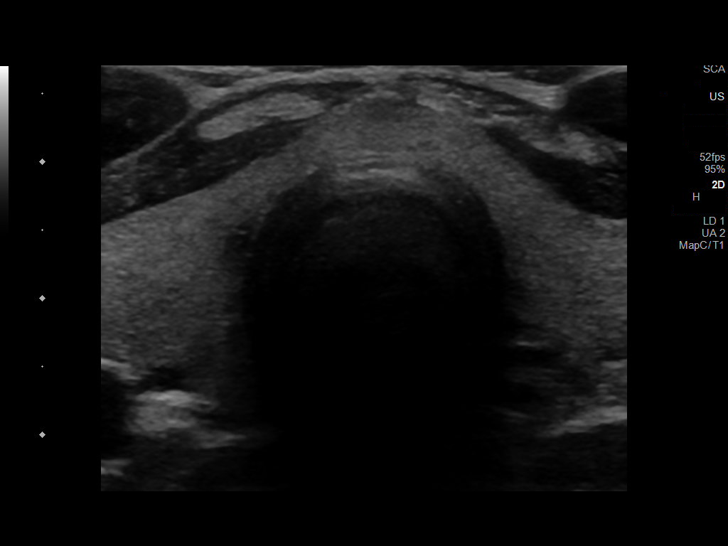

[14 of 25 positions shown; findings below may reference images not displayed]

FINDINGS: Parenchymal Echotexture: Normal

Isthmus: 3 mm

Right lobe: 3.7 x 0.9 x 1.5 cm

Left lobe: 3.8 x 1.4 x 1.7 cm

_________________________________________________________

Estimated total number of nodules >/= 1 cm: 0

Number of spongiform nodules >/=  2 cm not described below (TR1): 0

Number of mixed cystic and solid nodules >/= 1.5 cm not described
below (TR2): 0

_________________________________________________________

Normal thyroid echotexture. Tiny subcentimeter cystic nodules again
demonstrated predominantly on the right side measuring 2 mm or less
in size. These are benign. No other significant thyroid abnormality.
No hypervascularity or regional adenopathy.
IMPRESSION: No significant thyroid abnormality that warrants follow-up or
biopsy.

The above is in keeping with the ACR TI-RADS recommendations - [HOSPITAL] 6708;[DATE].

## 2021-11-01 ENCOUNTER — Other Ambulatory Visit: Payer: Self-pay | Admitting: Orthopedic Surgery

## 2021-11-27 ENCOUNTER — Encounter (HOSPITAL_BASED_OUTPATIENT_CLINIC_OR_DEPARTMENT_OTHER): Payer: Self-pay | Admitting: Orthopedic Surgery

## 2021-11-27 ENCOUNTER — Other Ambulatory Visit: Payer: Self-pay

## 2021-12-03 ENCOUNTER — Ambulatory Visit (HOSPITAL_BASED_OUTPATIENT_CLINIC_OR_DEPARTMENT_OTHER)
Admission: RE | Admit: 2021-12-03 | Discharge: 2021-12-03 | Disposition: A | Discharge status: Home / Self Care | Payer: Medicare Other | Attending: Orthopedic Surgery | Admitting: Orthopedic Surgery

## 2021-12-03 ENCOUNTER — Encounter (HOSPITAL_BASED_OUTPATIENT_CLINIC_OR_DEPARTMENT_OTHER)
Admission: RE | Disposition: A | Discharge status: Home / Self Care | Payer: Self-pay | Source: Home / Self Care | Attending: Orthopedic Surgery

## 2021-12-03 ENCOUNTER — Encounter (HOSPITAL_COMMUNITY): Payer: Self-pay | Admitting: Anesthesiology

## 2021-12-03 ENCOUNTER — Encounter (HOSPITAL_BASED_OUTPATIENT_CLINIC_OR_DEPARTMENT_OTHER): Payer: Self-pay | Admitting: Orthopedic Surgery

## 2021-12-03 ENCOUNTER — Other Ambulatory Visit: Payer: Self-pay

## 2021-12-03 DIAGNOSIS — Z539 Procedure and treatment not carried out, unspecified reason: Secondary | ICD-10-CM | POA: Diagnosis not present

## 2021-12-03 DIAGNOSIS — L989 Disorder of the skin and subcutaneous tissue, unspecified: Secondary | ICD-10-CM | POA: Diagnosis present

## 2021-12-03 SURGERY — EXCISION METACARPAL MASS
Anesthesia: Choice | Laterality: Right

## 2021-12-03 MED ORDER — ACETAMINOPHEN 500 MG PO TABS: 1000.0000 mg | ORAL_TABLET | Freq: Once | ORAL | Status: DC

## 2021-12-03 MED ORDER — ACETAMINOPHEN 500 MG PO TABS
ORAL_TABLET | ORAL | Status: AC
  Filled 2021-12-03: qty 2

## 2021-12-03 MED ORDER — MIDAZOLAM HCL 2 MG/2ML IJ SOLN
INTRAMUSCULAR | Status: AC
  Filled 2021-12-03: qty 2

## 2021-12-03 MED ORDER — LIDOCAINE 2% (20 MG/ML) 5 ML SYRINGE
INTRAMUSCULAR | Status: AC
  Filled 2021-12-03: qty 5

## 2021-12-03 MED ORDER — CLINDAMYCIN PHOSPHATE 900 MG/50ML IV SOLN: 900.0000 mg | INTRAVENOUS | Status: DC

## 2021-12-03 MED ORDER — LACTATED RINGERS IV SOLN: INTRAVENOUS | Status: DC

## 2021-12-03 MED ORDER — FENTANYL CITRATE (PF) 100 MCG/2ML IJ SOLN
INTRAMUSCULAR | Status: AC
  Filled 2021-12-03: qty 2

## 2021-12-03 MED ORDER — CLINDAMYCIN PHOSPHATE 900 MG/50ML IV SOLN
INTRAVENOUS | Status: AC
  Filled 2021-12-03: qty 50

## 2021-12-03 MED ORDER — PROPOFOL 10 MG/ML IV BOLUS
INTRAVENOUS | Status: AC
  Filled 2021-12-03: qty 20

## 2021-12-03 MED ORDER — BUPIVACAINE HCL (PF) 0.25 % IJ SOLN
INTRAMUSCULAR | Status: AC
  Filled 2021-12-03: qty 30

## 2021-12-03 NOTE — H&P (Signed)
Lesions on right thumb resolving.  Procedure cancelled at this time.  Follow up in office as needed. ?

## 2021-12-03 NOTE — OR Nursing (Signed)
Case cancelled patient states thinks lesion is getting better wants to watch so surgery cancelled ?

## 2021-12-03 NOTE — Anesthesia Preprocedure Evaluation (Deleted)
Anesthesia Evaluation  ?Patient identified by MRN, date of birth, ID band ?Patient awake ? ? ? ?Reviewed: ?Allergy & Precautions, NPO status , Patient's Chart, lab work & pertinent test results ? ?Airway ?Mallampati: II ? ?TM Distance: >3 FB ?Neck ROM: Full ? ? ? Dental ?  ?Pulmonary ?neg pulmonary ROS,  ?  ?breath sounds clear to auscultation ? ? ? ? ? ? Cardiovascular ?negative cardio ROS ? ? ?Rhythm:Regular Rate:Normal ? ? ?  ?Neuro/Psych ?negative neurological ROS ?   ? GI/Hepatic ?Neg liver ROS, GERD  ,  ?Endo/Other  ?negative endocrine ROS ? Renal/GU ?negative Renal ROS  ? ?  ?Musculoskeletal ? ? Abdominal ?  ?Peds ? Hematology ?negative hematology ROS ?(+)   ?Anesthesia Other Findings ? ? Reproductive/Obstetrics ? ?  ? ? ? ? ? ? ? ? ? ? ? ? ? ?  ?  ? ? ? ? ? ? ? ? ?Anesthesia Physical ?Anesthesia Plan ? ?ASA: 2 ? ?Anesthesia Plan:   ? ?Post-op Pain Management:   ? ?Induction:  ? ?PONV Risk Score and Plan:  ? ?Airway Management Planned:  ? ?Additional Equipment:  ? ?Intra-op Plan:  ? ?Post-operative Plan:  ? ?Informed Consent:  ? ?Plan Discussed with:  ? ?Anesthesia Plan Comments: (Discussed anesthetic options with patient. Pt had bier block last time with MAC anesthesia. She states that she does not wish to have GA as she was very groggy and felt bad for days after previous general anesthetics. Since there is a Psychologist, prison and probation services of 0.5% lidocaine we discussed that a bier block was not an option but that we could ask Dr. Fredna Dow to inject her thumb and we could do a MAC sedation for her with a GA TIVA with propofol as a back up plan. She agreed to [facility] sedation with local per surgeon and was amenable to TIVA GA as a backup plan as a last resort. She also requested no fentanyl or versed be given as well, and the plan was for MAC with propofol only and local per surgeon. Pt spoke with surgeon and after their discussion the case was cancelled as her thumb lesion was getting smaller  per Dr. Fredna Dow. ? ?R. Ola Spurr, MD)  ? ? ? ? ? ?Anesthesia Quick Evaluation ? ?

## 2022-06-04 ENCOUNTER — Other Ambulatory Visit: Payer: Self-pay

## 2022-06-04 ENCOUNTER — Emergency Department (HOSPITAL_BASED_OUTPATIENT_CLINIC_OR_DEPARTMENT_OTHER): Payer: Medicare Other

## 2022-06-04 ENCOUNTER — Encounter (HOSPITAL_BASED_OUTPATIENT_CLINIC_OR_DEPARTMENT_OTHER): Payer: Self-pay | Admitting: Emergency Medicine

## 2022-06-04 ENCOUNTER — Emergency Department (HOSPITAL_BASED_OUTPATIENT_CLINIC_OR_DEPARTMENT_OTHER)
Admission: EM | Admit: 2022-06-04 | Discharge: 2022-06-04 | Disposition: A | Discharge status: Home / Self Care | Payer: Medicare Other | Attending: Emergency Medicine | Admitting: Emergency Medicine

## 2022-06-04 DIAGNOSIS — Z91040 Latex allergy status: Secondary | ICD-10-CM | POA: Diagnosis not present

## 2022-06-04 DIAGNOSIS — R079 Chest pain, unspecified: Secondary | ICD-10-CM | POA: Diagnosis present

## 2022-06-04 DIAGNOSIS — K649 Unspecified hemorrhoids: Secondary | ICD-10-CM | POA: Diagnosis not present

## 2022-06-04 LAB — CBC WITH DIFFERENTIAL/PLATELET
Abs Immature Granulocytes: 0.01 10*3/uL (ref 0.00–0.07)
Basophils Absolute: 0 10*3/uL (ref 0.0–0.1)
Basophils Relative: 1 %
Eosinophils Absolute: 0.1 10*3/uL (ref 0.0–0.5)
Eosinophils Relative: 1 %
HCT: 38.9 % (ref 36.0–46.0)
Hemoglobin: 12.8 g/dL (ref 12.0–15.0)
Immature Granulocytes: 0 %
Lymphocytes Relative: 52 %
Lymphs Abs: 2.7 10*3/uL (ref 0.7–4.0)
MCH: 29.6 pg (ref 26.0–34.0)
MCHC: 32.9 g/dL (ref 30.0–36.0)
MCV: 90 fL (ref 80.0–100.0)
Monocytes Absolute: 0.4 10*3/uL (ref 0.1–1.0)
Monocytes Relative: 9 %
Neutro Abs: 1.9 10*3/uL (ref 1.7–7.7)
Neutrophils Relative %: 37 %
Platelets: 265 10*3/uL (ref 150–400)
RBC: 4.32 MIL/uL (ref 3.87–5.11)
RDW: 13.2 % (ref 11.5–15.5)
WBC: 5.1 10*3/uL (ref 4.0–10.5)
nRBC: 0 % (ref 0.0–0.2)

## 2022-06-04 LAB — COMPREHENSIVE METABOLIC PANEL
ALT: 19 U/L (ref 0–44)
AST: 24 U/L (ref 15–41)
Albumin: 3.8 g/dL (ref 3.5–5.0)
Alkaline Phosphatase: 76 U/L (ref 38–126)
Anion gap: 7 (ref 5–15)
BUN: 18 mg/dL (ref 6–20)
CO2: 24 mmol/L (ref 22–32)
Calcium: 9.3 mg/dL (ref 8.9–10.3)
Chloride: 106 mmol/L (ref 98–111)
Creatinine, Ser: 1.06 mg/dL — ABNORMAL HIGH (ref 0.44–1.00)
GFR, Estimated: >60 mL/min (ref >60)
Glucose, Bld: 91 mg/dL (ref 70–99)
Potassium: 4.1 mmol/L (ref 3.5–5.1)
Sodium: 137 mmol/L (ref 135–145)
Total Bilirubin: 0.7 mg/dL (ref 0.3–1.2)
Total Protein: 6.7 g/dL (ref 6.5–8.1)

## 2022-06-04 LAB — TROPONIN I (HIGH SENSITIVITY): Troponin I (High Sensitivity): 3 ng/L (ref <18)

## 2022-06-04 MED ORDER — HYDROCODONE-ACETAMINOPHEN 5-325 MG PO TABS: 1.0000 | ORAL_TABLET | Freq: Four times a day (QID) | ORAL | 0 refills | Status: DC | PRN

## 2022-06-04 MED ORDER — LIDOCAINE HCL URETHRAL/MUCOSAL 2 % EX GEL
1.0000 | Freq: Once | CUTANEOUS | Status: AC
  Administered 2022-06-04: 1 via TOPICAL
  Filled 2022-06-04: qty 11

## 2022-06-04 MED ORDER — LIDOCAINE 5 % EX OINT: 1.0000 | TOPICAL_OINTMENT | CUTANEOUS | 0 refills | Status: AC | PRN

## 2022-06-04 MED ORDER — MORPHINE SULFATE (PF) 4 MG/ML IV SOLN
4.0000 mg | Freq: Once | INTRAVENOUS | Status: AC
  Administered 2022-06-04: 4 mg via INTRAVENOUS
  Filled 2022-06-04: qty 1

## 2022-06-04 MED ORDER — SODIUM CHLORIDE 0.9 % IV BOLUS
1000.0000 mL | Freq: Once | INTRAVENOUS | Status: AC
  Administered 2022-06-04: 1000 mL via INTRAVENOUS

## 2022-06-04 NOTE — ED Triage Notes (Signed)
Intense rectal pain x 3 days. Also reports possible hemorrhoid that will not improve with otc creams. Unable to have BM due to pain. Today started having generalized malaise and chills.

## 2022-06-04 NOTE — Discharge Instructions (Addendum)
You have a large hemorrhoid that likely will need surgery.  I recommend that you continue to use Anusol cream.  I have prescribed lidocaine ointment that you can use as needed.  As we discussed, I recommend Colace and senna to keep your stool soft  You may take Vicodin as needed for severe pain  Please call surgery tomorrow to get an appointment  Return to ER if you have worse chest pain, abdominal pain, unable to have a bowel movement for a week.

## 2022-06-04 NOTE — ED Provider Notes (Signed)
[facility] EMERGENCY DEPARTMENT Provider Note   CSN: [id number] Arrival date & time: 06/04/22  1733     History  Chief Complaint  Patient presents with   Rectal Pain    Lori Martin is a 56 y.o. female here presenting with possible hemorrhoid and chest pain.  Patient states that she noticed a hemorrhoid for several days.  She has been using Anusol cream multiple times a day with minimal relief.  She states that she has severe rectal pain.  She also developed chest pain yesterday while she was straining.  Denies any vomiting or fevers.  The history is provided by the patient.       Home Medications Prior to Admission medications   Medication Sig Start Date End Date Taking? Authorizing Provider  esomeprazole (NEXIUM) 40 MG capsule Take 40 mg by mouth daily at 12 noon.    [provider]  Estradiol-Estriol-Progesterone (BIEST/PROGESTERONE) CREA Place onto the skin.    [provider]  progesterone (PROMETRIUM) 100 MG capsule Take 100 mg by mouth daily.    [provider]      Allergies    Penicillins, Tylenol [acetaminophen], Latex, and Phenergan [promethazine hcl]    Review of Systems   Review of Systems  Cardiovascular:  Positive for chest pain.  Gastrointestinal:        Rectal pain  All other systems reviewed and are negative.   Physical Exam Updated Vital Signs BP 115/80   Pulse 67   Temp 98 F (36.7 C)   Resp 11   Ht 5\' 7"  (1.702 m)   Wt 68 kg   SpO2 100%   BMI 23.49 kg/m  Physical Exam Vitals and nursing note reviewed.  Constitutional:      Comments: Uncomfortable  HENT:     Head: Normocephalic.     Nose: Nose normal.     Mouth/Throat:     Mouth: Mucous membranes are moist.  Eyes:     Extraocular Movements: Extraocular movements intact.     Pupils: Pupils are equal, round, and reactive to light.  Cardiovascular:     Rate and Rhythm: Normal rate and regular rhythm.     Pulses: Normal pulses.     Heart  sounds: Normal heart sounds.  Pulmonary:     Effort: Pulmonary effort is normal.     Breath sounds: Normal breath sounds.  Abdominal:     General: Abdomen is flat.     Palpations: Abdomen is soft.  Genitourinary:    Comments: Rectal- large hemorrhoid but no obvious thrombosis.  No active bleeding. Musculoskeletal:        General: Normal range of motion.     Cervical back: Normal range of motion and neck supple.  Skin:    General: Skin is warm.     Capillary Refill: Capillary refill takes less than 2 seconds.  Neurological:     General: No focal deficit present.     Mental Status: She is oriented to person, place, and time.  Psychiatric:        Mood and Affect: Mood normal.        Behavior: Behavior normal.     ED Results / Procedures / Treatments   Labs (all labs ordered are listed, but only abnormal results are displayed) Labs Reviewed  COMPREHENSIVE METABOLIC PANEL - Abnormal; Notable for the following components:      Result Value   Creatinine, Ser 1.06 (*)    All other components within normal limits  CBC  WITH DIFFERENTIAL/PLATELET  TROPONIN I (HIGH SENSITIVITY)    EKG EKG Interpretation  Date/Time:  Wednesday June 04 2022 18:04:56 EDT Ventricular Rate:  57 PR Interval:  162 QRS Duration: 89 QT Interval:  417 QTC Calculation: 406 R Axis:   62 Text Interpretation: Sinus rhythm No significant change since last tracing Confirmed by Wandra Arthurs [phone]) on 06/04/2022 6:53:03 PM  Radiology No results found.  Procedures Procedures    Medications Ordered in ED Medications  lidocaine (XYLOCAINE) 2 % jelly 1 Application (1 Application Topical Given 06/04/22 1755)  sodium chloride 0.9 % bolus 1,000 mL (1,000 mLs Intravenous New Bag/Given 06/04/22 1814)  morphine (PF) 4 MG/ML injection 4 mg (4 mg Intravenous Given 06/04/22 1814)    ED Course/ Medical Decision Making/ A&P                           Medical Decision Making Lori Martin is a 56 y.o. female  here presenting with rectal pain and chest pain.  Patient has been using Anusol cream.  She does have a large hemorrhoid but it does not appear to be thrombosed.  I think it is large enough she likely will need hemorrhoidectomy.  Chest pain could be from straining or from side effect of the Anusol cream.  She is low risk for ACS and pain since yesterday so we will get 1 set of troponin and also lab work.  We will give pain medicine and lidocaine cream for comfort.  7:29 PM Reviewed patient's labs and imaging studies.  Troponin negative x1.  Low suspicion for ACS.  Pain is under control now.  We will try viscous lidocaine as needed and I told her that she may need hemorrhoidectomy.  Stable for discharge.  Problems Addressed: Chest pain, unspecified type: acute illness or injury Hemorrhoids, unspecified hemorrhoid type: acute illness or injury  Amount and/or Complexity of Data Reviewed Labs: ordered. Decision-making details documented in ED Course. Radiology: ordered and independent interpretation performed. Decision-making details documented in ED Course. ECG/medicine tests: ordered and independent interpretation performed. Decision-making details documented in ED Course.  Risk Prescription drug management.    Final Clinical Impression(s) / ED Diagnoses Final diagnoses:  None    Rx / DC Orders ED Discharge Orders     None         Drenda Freeze, MD 06/04/22 1931

## 2022-12-02 ENCOUNTER — Other Ambulatory Visit: Payer: Self-pay | Admitting: Orthopedic Surgery

## 2022-12-12 ENCOUNTER — Encounter (HOSPITAL_BASED_OUTPATIENT_CLINIC_OR_DEPARTMENT_OTHER): Payer: Self-pay | Admitting: Orthopedic Surgery

## 2022-12-12 ENCOUNTER — Other Ambulatory Visit: Payer: Self-pay

## 2022-12-22 ENCOUNTER — Encounter (HOSPITAL_BASED_OUTPATIENT_CLINIC_OR_DEPARTMENT_OTHER)
Admission: RE | Disposition: A | Discharge status: Home / Self Care | Payer: Self-pay | Source: Home / Self Care | Attending: Orthopedic Surgery

## 2022-12-22 ENCOUNTER — Ambulatory Visit (HOSPITAL_BASED_OUTPATIENT_CLINIC_OR_DEPARTMENT_OTHER): Payer: Medicare Other | Admitting: Anesthesiology

## 2022-12-22 ENCOUNTER — Ambulatory Visit (HOSPITAL_BASED_OUTPATIENT_CLINIC_OR_DEPARTMENT_OTHER)
Admission: RE | Admit: 2022-12-22 | Discharge: 2022-12-22 | Disposition: A | Discharge status: Home / Self Care | Payer: Medicare Other | Attending: Orthopedic Surgery | Admitting: Orthopedic Surgery

## 2022-12-22 ENCOUNTER — Other Ambulatory Visit: Payer: Self-pay

## 2022-12-22 ENCOUNTER — Encounter (HOSPITAL_BASED_OUTPATIENT_CLINIC_OR_DEPARTMENT_OTHER): Payer: Self-pay | Admitting: Orthopedic Surgery

## 2022-12-22 DIAGNOSIS — L989 Disorder of the skin and subcutaneous tissue, unspecified: Secondary | ICD-10-CM

## 2022-12-22 DIAGNOSIS — B078 Other viral warts: Secondary | ICD-10-CM | POA: Diagnosis present

## 2022-12-22 HISTORY — PX: LESION REMOVAL: SHX5196

## 2022-12-22 SURGERY — EXCISION, LESION, HAND
Anesthesia: Regional | Site: Thumb | Laterality: Right

## 2022-12-22 MED ORDER — LACTATED RINGERS IV SOLN: INTRAVENOUS | Status: DC

## 2022-12-22 MED ORDER — FENTANYL CITRATE (PF) 100 MCG/2ML IJ SOLN
INTRAMUSCULAR | Status: DC | PRN
  Administered 2022-12-22: 50 ug via INTRAVENOUS

## 2022-12-22 MED ORDER — DEXMEDETOMIDINE HCL IN NACL 80 MCG/20ML IV SOLN
INTRAVENOUS | Status: DC | PRN
  Administered 2022-12-22: 4 ug via INTRAVENOUS
  Administered 2022-12-22: 8 ug via INTRAVENOUS

## 2022-12-22 MED ORDER — PROPOFOL 10 MG/ML IV BOLUS
INTRAVENOUS | Status: DC | PRN
  Administered 2022-12-22: 20 mg via INTRAVENOUS

## 2022-12-22 MED ORDER — ONDANSETRON HCL 4 MG/2ML IJ SOLN
INTRAMUSCULAR | Status: AC
  Filled 2022-12-22: qty 2

## 2022-12-22 MED ORDER — PROPOFOL 500 MG/50ML IV EMUL
INTRAVENOUS | Status: DC | PRN
  Administered 2022-12-22: 100 ug/kg/min via INTRAVENOUS

## 2022-12-22 MED ORDER — ACETAMINOPHEN 160 MG/5ML PO SOLN: 325.0000 mg | ORAL | Status: DC | PRN

## 2022-12-22 MED ORDER — 0.9 % SODIUM CHLORIDE (POUR BTL) OPTIME
TOPICAL | Status: DC | PRN
  Administered 2022-12-22: 100 mL

## 2022-12-22 MED ORDER — ACETAMINOPHEN 325 MG PO TABS: 325.0000 mg | ORAL_TABLET | ORAL | Status: DC | PRN

## 2022-12-22 MED ORDER — LIDOCAINE HCL (PF) 0.5 % IJ SOLN
INTRAMUSCULAR | Status: DC | PRN
  Administered 2022-12-22: 30 mL via INTRAVENOUS

## 2022-12-22 MED ORDER — ONDANSETRON HCL 4 MG/2ML IJ SOLN: 4.0000 mg | Freq: Once | INTRAMUSCULAR | Status: DC | PRN

## 2022-12-22 MED ORDER — ONDANSETRON HCL 4 MG/2ML IJ SOLN
INTRAMUSCULAR | Status: DC | PRN
  Administered 2022-12-22: 4 mg via INTRAVENOUS

## 2022-12-22 MED ORDER — OXYCODONE HCL 5 MG/5ML PO SOLN: 5.0000 mg | Freq: Once | ORAL | Status: DC | PRN

## 2022-12-22 MED ORDER — FENTANYL CITRATE (PF) 100 MCG/2ML IJ SOLN: 25.0000 ug | INTRAMUSCULAR | Status: DC | PRN

## 2022-12-22 MED ORDER — BUPIVACAINE HCL (PF) 0.25 % IJ SOLN
INTRAMUSCULAR | Status: DC | PRN
  Administered 2022-12-22: 9 mL

## 2022-12-22 MED ORDER — FENTANYL CITRATE (PF) 100 MCG/2ML IJ SOLN
INTRAMUSCULAR | Status: AC
  Filled 2022-12-22: qty 2

## 2022-12-22 MED ORDER — HYDROCODONE-ACETAMINOPHEN 5-325 MG PO TABS: ORAL_TABLET | ORAL | 0 refills | Status: AC

## 2022-12-22 MED ORDER — CEFAZOLIN SODIUM-DEXTROSE 2-4 GM/100ML-% IV SOLN
INTRAVENOUS | Status: AC
  Filled 2022-12-22: qty 100

## 2022-12-22 MED ORDER — CEFAZOLIN SODIUM-DEXTROSE 2-4 GM/100ML-% IV SOLN
2.0000 g | INTRAVENOUS | Status: AC
  Administered 2022-12-22: 2 g via INTRAVENOUS

## 2022-12-22 MED ORDER — MEPERIDINE HCL 25 MG/ML IJ SOLN: 6.2500 mg | INTRAMUSCULAR | Status: DC | PRN

## 2022-12-22 MED ORDER — MIDAZOLAM HCL 2 MG/2ML IJ SOLN
INTRAMUSCULAR | Status: AC
  Filled 2022-12-22: qty 2

## 2022-12-22 MED ORDER — MIDAZOLAM HCL 5 MG/5ML IJ SOLN
INTRAMUSCULAR | Status: DC | PRN
  Administered 2022-12-22: 2 mg via INTRAVENOUS

## 2022-12-22 MED ORDER — OXYCODONE HCL 5 MG PO TABS: 5.0000 mg | ORAL_TABLET | Freq: Once | ORAL | Status: DC | PRN

## 2022-12-22 SURGICAL SUPPLY — 60 items
APL PRP STRL LF DISP 70% ISPRP (MISCELLANEOUS) ×1
APL SKNCLS STERI-STRIP NONHPOA (GAUZE/BANDAGES/DRESSINGS)
BANDAGE GAUZE 1X75IN STRL (MISCELLANEOUS) IMPLANT
BENZOIN TINCTURE PRP APPL 2/3 (GAUZE/BANDAGES/DRESSINGS) IMPLANT
BLADE MINI RND TIP GREEN BEAV (BLADE) IMPLANT
BLADE SURG 15 STRL LF DISP TIS (BLADE) ×2 IMPLANT
BLADE SURG 15 STRL SS (BLADE) ×2
BNDG CMPR 5X2 CHSV 1 LYR STRL (GAUZE/BANDAGES/DRESSINGS)
BNDG CMPR 5X2 KNTD ELC UNQ LF (GAUZE/BANDAGES/DRESSINGS)
BNDG CMPR 5X3 KNIT ELC UNQ LF (GAUZE/BANDAGES/DRESSINGS)
BNDG CMPR 75X11 PLY HI ABS (MISCELLANEOUS)
BNDG CMPR 75X21 PLY HI ABS (MISCELLANEOUS)
BNDG CMPR 9X4 STRL LF SNTH (GAUZE/BANDAGES/DRESSINGS)
BNDG COHESIVE 1X5 TAN STRL LF (GAUZE/BANDAGES/DRESSINGS) IMPLANT
BNDG COHESIVE 2X5 TAN ST LF (GAUZE/BANDAGES/DRESSINGS) IMPLANT
BNDG ELASTIC 2INX 5YD STR LF (GAUZE/BANDAGES/DRESSINGS) IMPLANT
BNDG ELASTIC 3INX 5YD STR LF (GAUZE/BANDAGES/DRESSINGS) IMPLANT
BNDG ESMARK 4X9 LF (GAUZE/BANDAGES/DRESSINGS) IMPLANT
BNDG GAUZE 1X75IN STRL (MISCELLANEOUS)
BNDG GAUZE DERMACEA FLUFF 4 (GAUZE/BANDAGES/DRESSINGS) IMPLANT
BNDG GZE DERMACEA 4 6PLY (GAUZE/BANDAGES/DRESSINGS)
BNDG PLASTER X FAST 3X3 WHT LF (CAST SUPPLIES) IMPLANT
BNDG PLSTR 9X3 FST ST WHT (CAST SUPPLIES)
CHLORAPREP W/TINT 26 (MISCELLANEOUS) ×1 IMPLANT
CORD BIPOLAR FORCEPS 12FT (ELECTRODE) ×1 IMPLANT
COVER BACK TABLE 60X90IN (DRAPES) ×1 IMPLANT
COVER MAYO STAND STRL (DRAPES) ×1 IMPLANT
CUFF TOURN SGL QUICK 18X4 (TOURNIQUET CUFF) ×1 IMPLANT
DRAPE EXTREMITY T 121X128X90 (DISPOSABLE) ×1 IMPLANT
DRAPE SURG 17X23 STRL (DRAPES) ×1 IMPLANT
GAUZE SPONGE 4X4 12PLY STRL (GAUZE/BANDAGES/DRESSINGS) ×1 IMPLANT
GAUZE STRETCH 2X75IN STRL (MISCELLANEOUS) IMPLANT
GAUZE XEROFORM 1X8 LF (GAUZE/BANDAGES/DRESSINGS) ×1 IMPLANT
GLOVE BIO SURGEON STRL SZ7.5 (GLOVE) ×1 IMPLANT
GLOVE BIOGEL PI IND STRL 8 (GLOVE) ×1 IMPLANT
GLOVE SURG SYN 7.5  E (GLOVE) ×1
GLOVE SURG SYN 7.5 E (GLOVE) ×1 IMPLANT
GLOVE SURG SYN 7.5 PF PI (GLOVE) IMPLANT
GOWN STRL REUS W/ TWL LRG LVL3 (GOWN DISPOSABLE) ×1 IMPLANT
GOWN STRL REUS W/TWL LRG LVL3 (GOWN DISPOSABLE) ×1
GOWN STRL REUS W/TWL XL LVL3 (GOWN DISPOSABLE) ×1 IMPLANT
NDL HYPO 25X1 1.5 SAFETY (NEEDLE) ×1 IMPLANT
NEEDLE HYPO 25X1 1.5 SAFETY (NEEDLE) ×1 IMPLANT
NS IRRIG 1000ML POUR BTL (IV SOLUTION) ×1 IMPLANT
PACK BASIN DAY SURGERY FS (CUSTOM PROCEDURE TRAY) ×1 IMPLANT
PAD CAST 3X4 CTTN HI CHSV (CAST SUPPLIES) IMPLANT
PAD CAST 4YDX4 CTTN HI CHSV (CAST SUPPLIES) IMPLANT
PADDING CAST ABS COTTON 4X4 ST (CAST SUPPLIES) ×1 IMPLANT
PADDING CAST COTTON 3X4 STRL (CAST SUPPLIES)
PADDING CAST COTTON 4X4 STRL (CAST SUPPLIES)
STOCKINETTE 4X48 STRL (DRAPES) ×1 IMPLANT
STRIP CLOSURE SKIN 1/2X4 (GAUZE/BANDAGES/DRESSINGS) IMPLANT
SUT ETHILON 3 0 PS 1 (SUTURE) IMPLANT
SUT ETHILON 4 0 PS 2 18 (SUTURE) ×1 IMPLANT
SUT NYLON ETHILON 5-0 P-3 1X18 (SUTURE) IMPLANT
SUT VIC AB 4-0 P2 18 (SUTURE) IMPLANT
SYR BULB EAR ULCER 3OZ GRN STR (SYRINGE) ×1 IMPLANT
SYR CONTROL 10ML LL (SYRINGE) ×1 IMPLANT
TOWEL GREEN STERILE FF (TOWEL DISPOSABLE) ×2 IMPLANT
UNDERPAD 30X36 HEAVY ABSORB (UNDERPADS AND DIAPERS) ×1 IMPLANT

## 2022-12-22 NOTE — Anesthesia Preprocedure Evaluation (Addendum)
Anesthesia Evaluation  Patient identified by MRN, date of birth, ID band Patient awake    Reviewed: Allergy & Precautions, NPO status , Patient's Chart, lab work & pertinent test results  History of Anesthesia Complications (+) history of anesthetic complications  Airway Mallampati: II  TM Distance: >3 FB Neck ROM: Full    Dental no notable dental hx.    Pulmonary neg pulmonary ROS   Pulmonary exam normal breath sounds clear to auscultation       Cardiovascular negative cardio ROS Normal cardiovascular exam Rhythm:Regular Rate:Normal     Neuro/Psych negative neurological ROS  negative psych ROS   GI/Hepatic Neg liver ROS,GERD  ,,  Endo/Other  negative endocrine ROS    Renal/GU negative Renal ROS  negative genitourinary   Musculoskeletal negative musculoskeletal ROS (+)    Abdominal   Peds negative pediatric ROS (+)  Hematology negative hematology ROS (+)   Anesthesia Other Findings   Reproductive/Obstetrics negative OB ROS                              Anesthesia Physical Anesthesia Plan  ASA: 2  Anesthesia Plan: Bier Block and Bier Block-LIDOCAINE ONLY   Post-op Pain Management: Minimal or no pain anticipated   Induction: Intravenous  PONV Risk Score and Plan: 2 and Ondansetron, Midazolam and Treatment may vary due to age or medical condition  Airway Management Planned: Simple Face Mask  Additional Equipment: None  Intra-op Plan:   Post-operative Plan:   Informed Consent: I have reviewed the patients History and Physical, chart, labs and discussed the procedure including the risks, benefits and alternatives for the proposed anesthesia with the patient or authorized representative who has indicated his/her understanding and acceptance.     Dental advisory given  Plan Discussed with: CRNA and Anesthesiologist  Anesthesia Plan Comments:          Anesthesia  Quick Evaluation

## 2022-12-22 NOTE — Anesthesia Procedure Notes (Signed)
Anesthesia Regional Block: Bier block (IV Regional)   Pre-Anesthetic Checklist: , timeout performed,  Correct Patient, Correct Site, Correct Laterality,  Correct Procedure,, site marked,  Surgical consent,  At surgeon's request  Laterality: Right and Lower         Needles:  Injection technique: Single-shot  Needle Type: Other      Needle Gauge: 22     Additional Needles:   Procedures:,,,,, intact distal pulses, Esmarch exsanguination,  Single tourniquet utilized    Narrative:  Start time: 12/22/2022 2:10 PM End time: 12/22/2022 2:10 PM Injection made incrementally with aspirations every 30 mL.  Performed by: Personally

## 2022-12-22 NOTE — H&P (Signed)
  Lori Martin is an 57 y.o. female.   Chief Complaint: thumb lesion HPI: 57 yo female with right thumb wart.  Has decreased in size with topical treatment but not resolved.  She has had previous wart excision with good result.  She wishes to have this wart removed.  Allergies:  Allergies  Allergen Reactions   Gluten Meal Other (See Comments)    GI upset   Penicillins Other (See Comments)    "I don't remember but I don't take it"   Tylenol [Acetaminophen] Other (See Comments)    Indigestion and GI problems    Latex Itching   Phenergan [Promethazine Hcl] Anxiety    Pt had panic attack and extreme anxiety    Past Medical History:  Diagnosis Date   Complication of anesthesia    slow to wake up   GERD (gastroesophageal reflux disease)    Leaky pulmonary heart valve     Past Surgical History:  Procedure Laterality Date   BREAST SURGERY     lumpectomy x3   CESAREAN SECTION     MASS EXCISION Right 06/07/2020   Procedure: EXCISION LESION RIGHT THUMB;  Surgeon: [name], MD;  Location: [facility];  Service: Orthopedics;  Laterality: Right;   OSTEOTOMY      Family History: Family History  Problem Relation Age of Onset   Hypertension Father     Social History:   reports that she has never smoked. She has never used smokeless tobacco. She reports that she does not drink alcohol and does not use drugs.  Medications: Medications Prior to Admission  Medication Sig Dispense Refill   esomeprazole (NEXIUM) 40 MG capsule Take 40 mg by mouth daily at 12 noon.     Estradiol-Estriol-Progesterone (BIEST/PROGESTERONE) CREA Place onto the skin.     HYDROcodone-acetaminophen (NORCO/VICODIN) 5-325 MG tablet Take 1 tablet by mouth every 6 (six) hours as needed. 10 tablet 0   lidocaine (XYLOCAINE) 5 % ointment Apply 1 Application topically as needed. 240 g 0   progesterone (PROMETRIUM) 100 MG capsule Take 100 mg by mouth daily.      No results found for this or any  previous visit (from the past 48 hour(s)).  No results found.    Height 5\' 7"  (1.702 m), weight 65.8 kg.  General appearance: alert, cooperative, and appears stated age Head: Normocephalic, without obvious abnormality, atraumatic Neck: supple, symmetrical, trachea midline Extremities: Intact sensation and capillary refill all digits.  +epl/fpl/io.  No wounds. Two darkened spots consistent with verruca on right thumb by nail. Pulses: 2+ and symmetric Skin: Skin color, texture, turgor normal. No rashes or lesions Neurologic: Grossly normal Incision/Wound: none  Assessment/Plan Right thumb lesions.  Non operative and operative treatment options have been discussed with the patient and patient wishes to proceed with operative treatment. Risks, benefits, and alternatives of surgery have been discussed and the patient agrees with the plan of care.   [name] 12/22/2022, 12:53 PM

## 2022-12-22 NOTE — Op Note (Signed)
NAME: Lori Martin MEDICAL RECORD NO: [id number] DATE OF BIRTH: [birth year] FACILITY: [facility] LOCATION: [facility] PHYSICIAN: Tami Ribas, MD   OPERATIVE REPORT   DATE OF PROCEDURE: 12/22/22    PREOPERATIVE DIAGNOSIS: Right thumb lesions x 3   POSTOPERATIVE DIAGNOSIS: Right thumb lesions x 3   PROCEDURE: 1.  Excision thumb lesions x 2, excised diameter approximately 6 mm 2.  Excision thumb lesion, excised diameter approximately 1.5 mm   SURGEON:  [name], M.D.   ASSISTANT: none   ANESTHESIA:  Bier block with sedation   INTRAVENOUS FLUIDS:  Per anesthesia flow sheet.   ESTIMATED BLOOD LOSS:  Minimal.   COMPLICATIONS:  None.   SPECIMENS: Right thumb lesions to pathology   TOURNIQUET TIME:    Total Tourniquet Time Documented: Forearm (Right) - 17 minutes Total: Forearm (Right) - 17 minutes    DISPOSITION:  Stable to PACU.   INDICATIONS: 57 year old female with lesions on her thumb consistent with verruca.  She has had previous verruca's excised.  She wishes to have these removed.  There are 2 lesions adjacent to each other and additional 1 that she has noted more recently at the edge of the cuticle.  Risks, benefits and alternatives of surgery were discussed including the risks of blood loss, infection, damage to nerves, vessels, tendons, ligaments, bone for surgery, need for additional surgery, complications with wound healing, continued pain, stiffness, , recurrence.  She voiced understanding of these risks and elected to proceed.  OPERATIVE COURSE:  After being identified preoperatively by myself,  the patient and I agreed on the procedure and site of the procedure.  The surgical site was marked.  Surgical consent had been signed. Preoperative IV antibiotic prophylaxis was given. She was transferred to the operating room and placed on the operating table in supine position with the Right upper extremity on an arm board.  Bier block anesthesia was  induced by the anesthesiologist.  Right upper extremity was prepped and draped in normal sterile orthopedic fashion.  A surgical pause was performed between the surgeons, anesthesia, and operating room staff and all were in agreement as to the patient, procedure, and site of procedure.  Tourniquet at the proximal aspect of the forearm had been inflated for the Bier block.  The 2 main lesions of approximate 1 mm each were able to be excised in a single excised block.  The skin was ellipsed around these 2 lesions totaling approximately 6 mm for both lesions to be excised.  The subcutaneous tissue was elevated with the knife blade to try to remove all of the lesion.  It was sent to pathology for examination.  The additional lesion more dorsal on the thumb at the cuticle was also able to be ellipsed out.  This was 1 to 1.5 mm in diameter.  The lesions were darker in the skin and were able to be visualized on the undersurface of the dermis.  All lesion appeared to be removed.  The wounds were irrigated with sterile saline and closed with 5-0 nylon in a horizontal mattress and interrupted fashion.  Digital block of been performed with quarter percent plain Marcaine to aid in postoperative analgesia.  The wounds were dressed with sterile Xeroform and 4 x 4 and wrapped with a Coban dressing lightly.  AlumaFoam splint was placed and wrapped lightly with Coban dressing.  The tourniquet was deflated at 17 minutes.  Fingertips were pink with brisk capillary refill after deflation of tourniquet.  The operative  drapes were broken down.  The patient was awoken from anesthesia safely.  She was transferred back to the stretcher and taken to PACU in stable condition.  I will see her back in the office in 1 week for postoperative followup.  I will give her a prescription for Norco 5/325 1-2 tabs PO q6 hours prn pain, dispense # 15.   [name], MD Electronically signed, 12/22/22

## 2022-12-22 NOTE — Discharge Instructions (Addendum)

## 2022-12-22 NOTE — Transfer of Care (Signed)
Immediate Anesthesia Transfer of Care Note  Patient: Lori Martin  Procedure(s) Performed: EXCISION LESION RIGHT THUMB (Right: Thumb)  Patient Location: PACU  Anesthesia Type:MAC and Bier block  Level of Consciousness: sedated  Airway & Oxygen Therapy: Patient Spontanous Breathing and Patient connected to face mask oxygen  Post-op Assessment: Report given to RN and Post -op Vital signs reviewed and stable  Post vital signs: Reviewed and stable  Last Vitals:  Vitals Value Taken Time  BP    Temp    Pulse    Resp    SpO2      Last Pain:  Vitals:   12/22/22 1315  TempSrc: Oral  PainSc: 0-No pain      Patients Stated Pain Goal: 5 (12/22/22 1315)  Complications: No notable events documented.

## 2022-12-22 NOTE — Anesthesia Postprocedure Evaluation (Signed)
Anesthesia Post Note  Patient: Lori Martin  Procedure(s) Performed: EXCISION LESION RIGHT THUMB (Right: Thumb)     Patient location during evaluation: PACU Anesthesia Type: Bier Block and MAC Level of consciousness: awake Pain management: pain level controlled Vital Signs Assessment: post-procedure vital signs reviewed and stable Respiratory status: spontaneous breathing, nonlabored ventilation and respiratory function stable Cardiovascular status: stable and blood pressure returned to baseline Postop Assessment: no apparent nausea or vomiting Anesthetic complications: no   No notable events documented.  Last Vitals:  Vitals:   12/22/22 1435 12/22/22 1450  BP: (!) 85/52 (!) 91/58  Pulse: (!) 50 60  Resp: 12 15  Temp: (!) 36.2 C   SpO2: 100% 100%    Last Pain:  Vitals:   12/22/22 1450  TempSrc:   PainSc: 0-No pain                 Linton Rump

## 2022-12-23 ENCOUNTER — Encounter (HOSPITAL_BASED_OUTPATIENT_CLINIC_OR_DEPARTMENT_OTHER): Payer: Self-pay | Admitting: Orthopedic Surgery

## 2022-12-24 LAB — SURGICAL PATHOLOGY

## 2023-10-02 ENCOUNTER — Encounter (HOSPITAL_BASED_OUTPATIENT_CLINIC_OR_DEPARTMENT_OTHER): Payer: Self-pay

## 2023-10-02 ENCOUNTER — Emergency Department (HOSPITAL_BASED_OUTPATIENT_CLINIC_OR_DEPARTMENT_OTHER)
Admission: EM | Admit: 2023-10-02 | Discharge: 2023-10-02 | Disposition: A | Discharge status: Home / Self Care | Payer: Medicare Other | Attending: Emergency Medicine | Admitting: Emergency Medicine

## 2023-10-02 ENCOUNTER — Emergency Department (HOSPITAL_BASED_OUTPATIENT_CLINIC_OR_DEPARTMENT_OTHER): Payer: Medicare Other | Admitting: Radiology

## 2023-10-02 DIAGNOSIS — R079 Chest pain, unspecified: Secondary | ICD-10-CM | POA: Insufficient documentation

## 2023-10-02 LAB — BASIC METABOLIC PANEL
Anion gap: 9 (ref 5–15)
BUN: 12 mg/dL (ref 6–20)
CO2: 25 mmol/L (ref 22–32)
Calcium: 9.6 mg/dL (ref 8.9–10.3)
Chloride: 106 mmol/L (ref 98–111)
Creatinine, Ser: 0.98 mg/dL (ref 0.44–1.00)
GFR, Estimated: >60 mL/min (ref >60)
Glucose, Bld: 73 mg/dL (ref 70–99)
Potassium: 3.8 mmol/L (ref 3.5–5.1)
Sodium: 140 mmol/L (ref 135–145)

## 2023-10-02 LAB — CBC
HCT: 39.9 % (ref 36.0–46.0)
Hemoglobin: 13 g/dL (ref 12.0–15.0)
MCH: 30.1 pg (ref 26.0–34.0)
MCHC: 32.6 g/dL (ref 30.0–36.0)
MCV: 92.4 fL (ref 80.0–100.0)
Platelets: 271 10*3/uL (ref 150–400)
RBC: 4.32 MIL/uL (ref 3.87–5.11)
RDW: 13.2 % (ref 11.5–15.5)
WBC: 4.5 10*3/uL (ref 4.0–10.5)
nRBC: 0 % (ref 0.0–0.2)

## 2023-10-02 LAB — TROPONIN I (HIGH SENSITIVITY)
Troponin I (High Sensitivity): 3 ng/L (ref <18)
Troponin I (High Sensitivity): 3 ng/L (ref <18)

## 2023-10-02 LAB — D-DIMER, QUANTITATIVE: D-Dimer, Quant: <0.27 ug{FEU}/mL (ref 0.00–0.50)

## 2023-10-02 MED ORDER — ACETAMINOPHEN 500 MG PO TABS: 1000.0000 mg | ORAL_TABLET | Freq: Once | ORAL | Status: DC

## 2023-10-02 MED ORDER — FAMOTIDINE 20 MG PO TABS
20.0000 mg | ORAL_TABLET | Freq: Once | ORAL | Status: AC
  Administered 2023-10-02: 20 mg via ORAL
  Filled 2023-10-02: qty 1

## 2023-10-02 NOTE — ED Provider Notes (Signed)
Lori Martin   CSN: [id number] Arrival date & time: 10/02/23  1719     History Chief Complaint  Patient presents with   Chest Pain    HPI Lori Martin is a 58 y.o. female presenting for palpitations and SOB last night.  Woke up ok this morning. Started to have pain today. Progressive in nature. HX of GERD/Esophagitis on NEXIUM On Progesterone.  Patient's recorded medical, surgical, social, medication list and allergies were reviewed in the Snapshot window as part of the initial history.   Review of Systems   Review of Systems  Constitutional:  Negative for chills and fever.  HENT:  Negative for ear pain and sore throat.   Eyes:  Negative for pain and visual disturbance.  Respiratory:  Positive for chest tightness and shortness of breath. Negative for cough.   Cardiovascular:  Positive for chest pain. Negative for palpitations.  Gastrointestinal:  Negative for abdominal pain and vomiting.  Genitourinary:  Negative for dysuria and hematuria.  Musculoskeletal:  Negative for arthralgias and back pain.  Skin:  Negative for color change and rash.  Neurological:  Negative for seizures and syncope.  All other systems reviewed and are negative.   Physical Exam Updated Vital Signs BP 118/67   Pulse (!) 59   Temp 98 F (36.7 C)   Resp 18   Ht 5\' 7"  (1.702 m)   Wt 70.3 kg   SpO2 100%   BMI 24.28 kg/m  Physical Exam Vitals and nursing Martin reviewed.  Constitutional:      General: She is not in acute distress.    Appearance: She is well-developed.  HENT:     Head: Normocephalic and atraumatic.  Eyes:     Conjunctiva/sclera: Conjunctivae normal.  Cardiovascular:     Rate and Rhythm: Normal rate and regular rhythm.     Heart sounds: No murmur heard. Pulmonary:     Effort: Pulmonary effort is normal. No respiratory distress.     Breath sounds: Normal breath sounds.  Abdominal:     General: There is no  distension.     Palpations: Abdomen is soft.     Tenderness: There is no abdominal tenderness. There is no right CVA tenderness or left CVA tenderness.  Musculoskeletal:        General: No swelling or tenderness. Normal range of motion.     Cervical back: Neck supple.  Skin:    General: Skin is warm and dry.  Neurological:     General: No focal deficit present.     Mental Status: She is alert and oriented to person, place, and time. Mental status is at baseline.     Cranial Nerves: No cranial nerve deficit.      ED Course/ Medical Decision Making/ A&P    Procedures Procedures   Medications Ordered in ED Medications  famotidine (PEPCID) tablet 20 mg (20 mg Oral Given 10/02/23 1924)   Medical Decision Making: Lori Martin is a 58 y.o. female who presented to the ED today with chest pain, detailed above.  Based on patient's comorbidities, patient has a heart score of 2.    Patient placed on continuous vitals and telemetry monitoring while in ED which was reviewed periodically.  Complete initial physical exam performed, notably the patient was HDS in NAD.   Reviewed and confirmed nursing documentation for past medical history, family history, social history.    Initial Assessment: With the patient's presentation of left-sided chest pain, most likely diagnosis  is musculoskeletal chest pain versus GERD, although ACS remains on the differential. Other diagnoses were considered including (but not limited to) pulmonary embolism, community-acquired pneumonia, aortic dissection, pneumothorax, underlying bony abnormality, anemia. These are considered less likely due to history of present illness and physical exam findings.    In particular, concerning pulmonary embolism:  they deny malignancy, recent surgery, history of DVT, or calf tenderness leading to a LOW risk Wells score. Aortic Dissection also reconsidered but seems less likely based on the location, quality, onset, and severity of  symptoms in this case. Patient has a lack of serious comorbidities for this condition including a lack of HTN or Smoking. Patient also has a lack of underlying history of AD or TAA.  This is most consistent with an acute life/limb threatening illness complicated by underlying chronic conditions.   Initial Plan: Evaluate for ACS with delta troponin and EKG evaluated as below  Evaluate for dissection, bony abnormality, or pneumonia with chest x-ray and screening laboratory evaluation including CBC, BMP  Further evaluation for pulmonary embolism indicated at this time based on patient's PERC and Wells score. Will proceed with DDIMER Further evaluation for Thoracic Aortic Dissection not indicated at this time based on patient's clinical history and PE findings.   Initial Study Results: EKG was reviewed independently. Rate, rhythm, axis, intervals all examined and without medically relevant abnormality. ST segments without concerns for elevations.    Laboratory  Delta troponin demonstrated NAA  DDimer CBC and BMP without obvious metabolic or inflammatory abnormalities requiring further evaluation   Radiology  DG Chest 2 View Result Date: 10/02/2023 CLINICAL DATA:  Chest pain. EXAM: CHEST - 2 VIEW COMPARISON:  06/04/2022 FINDINGS: The cardiomediastinal contours are normal. The lungs are clear. Pulmonary vasculature is normal. No consolidation, pleural effusion, or pneumothorax. No acute osseous abnormalities are seen. IMPRESSION: No active cardiopulmonary disease. Electronically Signed   By: Narda Rutherford M.D.   On: 10/02/2023 19:27    Final Assessment and Plan: On repeat clinical assessment, patient has had complete symptomatic resolution.  They are currently ambulatory tolerating p.o. intake.  They deny fevers or chills, nausea vomiting, syncope shortness of breath.  Favor likely musculoskeletal versus gastroesophageal etiology of patient's symptoms.  Considered ACS grossly less likely given  resolution, well appearance and negative troponin and low HEART score.  Patient to follow-up with primary care provider within 48 hours for ongoing care and management.   Disposition:  I have considered need for hospitalization, however, considering all of the above, I believe this patient is stable for discharge at this time.  Patient/family educated about specific return precautions for given chief complaint and symptoms.  Patient/family educated about follow-up with PCP and cardiology [facility]).     Patient/family expressed understanding of return precautions and need for follow-up. Patient spoken to regarding all imaging and laboratory results and appropriate follow up for these results. All education provided in verbal form with additional information in written form. Time was allowed for answering of patient questions. Patient discharged.    Emergency Department Medication Summary:   Medications  famotidine (PEPCID) tablet 20 mg (20 mg Oral Given 10/02/23 1924)    Clinical Impression:  1. Chest pain, unspecified type      Discharge   Final Clinical Impression(s) / ED Diagnoses Final diagnoses:  Chest pain, unspecified type    Rx / DC Orders ED Discharge Orders     None         Glyn Ade, MD 10/02/23 2011

## 2023-10-02 NOTE — ED Triage Notes (Signed)
Onset last night.  Chest pain mid chest to left side.  Associated with tiredness and short of breath.  Pain was aching and now sharp.
